# Patient Record
Sex: Male | Born: 1960 | Race: White | Hispanic: No | Marital: Married | Smoking: Never smoker
Health system: Southern US, Community
[De-identification: ages and names within clinical notes are randomized; demographics above are authoritative.]

## PROBLEM LIST (undated history)

## (undated) DIAGNOSIS — I2510 Atherosclerotic heart disease of native coronary artery without angina pectoris: Secondary | ICD-10-CM

## (undated) DIAGNOSIS — E1140 Type 2 diabetes mellitus with diabetic neuropathy, unspecified: Secondary | ICD-10-CM

## (undated) DIAGNOSIS — N529 Male erectile dysfunction, unspecified: Secondary | ICD-10-CM

## (undated) DIAGNOSIS — I10 Essential (primary) hypertension: Secondary | ICD-10-CM

## (undated) DIAGNOSIS — F32A Depression, unspecified: Secondary | ICD-10-CM

## (undated) DIAGNOSIS — E785 Hyperlipidemia, unspecified: Secondary | ICD-10-CM

## (undated) DIAGNOSIS — J342 Deviated nasal septum: Secondary | ICD-10-CM

## (undated) DIAGNOSIS — F329 Major depressive disorder, single episode, unspecified: Secondary | ICD-10-CM

## (undated) DIAGNOSIS — I255 Ischemic cardiomyopathy: Secondary | ICD-10-CM

## (undated) DIAGNOSIS — R809 Proteinuria, unspecified: Secondary | ICD-10-CM

## (undated) DIAGNOSIS — E538 Deficiency of other specified B group vitamins: Secondary | ICD-10-CM

## (undated) DIAGNOSIS — E1129 Type 2 diabetes mellitus with other diabetic kidney complication: Secondary | ICD-10-CM

## (undated) DIAGNOSIS — E11319 Type 2 diabetes mellitus with unspecified diabetic retinopathy without macular edema: Secondary | ICD-10-CM

## (undated) DIAGNOSIS — E119 Type 2 diabetes mellitus without complications: Secondary | ICD-10-CM

## (undated) DIAGNOSIS — E291 Testicular hypofunction: Secondary | ICD-10-CM

## (undated) DIAGNOSIS — E669 Obesity, unspecified: Secondary | ICD-10-CM

## (undated) DIAGNOSIS — Z87898 Personal history of other specified conditions: Secondary | ICD-10-CM

## (undated) HISTORY — PX: EYE SURGERY: SHX253

## (undated) HISTORY — PX: CHOLECYSTECTOMY: SHX55

## (undated) HISTORY — PX: TONSILLECTOMY: SUR1361

## (undated) HISTORY — PX: CORONARY ANGIOPLASTY: SHX604

## (undated) SURGERY — LEFT HEART CATH AND CORONARY ANGIOGRAPHY
Anesthesia: Moderate Sedation

---

## 2006-10-03 ENCOUNTER — Ambulatory Visit: Payer: Self-pay | Admitting: Unknown Physician Specialty

## 2007-04-22 ENCOUNTER — Ambulatory Visit: Payer: Self-pay | Admitting: Family Medicine

## 2009-03-17 ENCOUNTER — Ambulatory Visit: Payer: Self-pay | Admitting: Internal Medicine

## 2009-04-13 ENCOUNTER — Ambulatory Visit: Payer: Self-pay | Admitting: Internal Medicine

## 2014-03-06 HISTORY — PX: CORONARY ARTERY BYPASS GRAFT: SHX141

## 2014-11-20 ENCOUNTER — Encounter: Payer: Self-pay | Admitting: *Deleted

## 2014-11-21 ENCOUNTER — Encounter: Payer: Self-pay | Admitting: *Deleted

## 2014-11-21 ENCOUNTER — Encounter
Admission: RE | Disposition: A | Discharge status: Home / Self Care | Payer: Self-pay | Source: Ambulatory Visit | Attending: Cardiology

## 2014-11-21 ENCOUNTER — Ambulatory Visit
Admission: RE | Admit: 2014-11-21 | Discharge: 2014-11-21 | Disposition: A | Discharge status: Home / Self Care | Payer: BLUE CROSS/BLUE SHIELD | Source: Ambulatory Visit | Attending: Cardiology | Admitting: Cardiology

## 2014-11-21 DIAGNOSIS — Z801 Family history of malignant neoplasm of trachea, bronchus and lung: Secondary | ICD-10-CM | POA: Insufficient documentation

## 2014-11-21 DIAGNOSIS — Z794 Long term (current) use of insulin: Secondary | ICD-10-CM | POA: Diagnosis not present

## 2014-11-21 DIAGNOSIS — Z833 Family history of diabetes mellitus: Secondary | ICD-10-CM | POA: Insufficient documentation

## 2014-11-21 DIAGNOSIS — I2510 Atherosclerotic heart disease of native coronary artery without angina pectoris: Secondary | ICD-10-CM | POA: Diagnosis not present

## 2014-11-21 DIAGNOSIS — E1140 Type 2 diabetes mellitus with diabetic neuropathy, unspecified: Secondary | ICD-10-CM | POA: Insufficient documentation

## 2014-11-21 DIAGNOSIS — I10 Essential (primary) hypertension: Secondary | ICD-10-CM | POA: Diagnosis not present

## 2014-11-21 DIAGNOSIS — E669 Obesity, unspecified: Secondary | ICD-10-CM | POA: Insufficient documentation

## 2014-11-21 DIAGNOSIS — F329 Major depressive disorder, single episode, unspecified: Secondary | ICD-10-CM | POA: Diagnosis not present

## 2014-11-21 DIAGNOSIS — R079 Chest pain, unspecified: Secondary | ICD-10-CM | POA: Diagnosis present

## 2014-11-21 DIAGNOSIS — R808 Other proteinuria: Secondary | ICD-10-CM | POA: Diagnosis not present

## 2014-11-21 DIAGNOSIS — Z8261 Family history of arthritis: Secondary | ICD-10-CM | POA: Diagnosis not present

## 2014-11-21 DIAGNOSIS — Z7982 Long term (current) use of aspirin: Secondary | ICD-10-CM | POA: Diagnosis not present

## 2014-11-21 DIAGNOSIS — R0602 Shortness of breath: Secondary | ICD-10-CM | POA: Insufficient documentation

## 2014-11-21 DIAGNOSIS — I429 Cardiomyopathy, unspecified: Secondary | ICD-10-CM | POA: Insufficient documentation

## 2014-11-21 DIAGNOSIS — E785 Hyperlipidemia, unspecified: Secondary | ICD-10-CM | POA: Diagnosis not present

## 2014-11-21 DIAGNOSIS — R5383 Other fatigue: Secondary | ICD-10-CM | POA: Insufficient documentation

## 2014-11-21 DIAGNOSIS — Z8249 Family history of ischemic heart disease and other diseases of the circulatory system: Secondary | ICD-10-CM | POA: Insufficient documentation

## 2014-11-21 DIAGNOSIS — Z79899 Other long term (current) drug therapy: Secondary | ICD-10-CM | POA: Insufficient documentation

## 2014-11-21 DIAGNOSIS — Z6830 Body mass index (BMI) 30.0-30.9, adult: Secondary | ICD-10-CM | POA: Diagnosis not present

## 2014-11-21 DIAGNOSIS — Z9049 Acquired absence of other specified parts of digestive tract: Secondary | ICD-10-CM | POA: Diagnosis not present

## 2014-11-21 DIAGNOSIS — Z720 Tobacco use: Secondary | ICD-10-CM | POA: Diagnosis not present

## 2014-11-21 DIAGNOSIS — E11319 Type 2 diabetes mellitus with unspecified diabetic retinopathy without macular edema: Secondary | ICD-10-CM | POA: Diagnosis not present

## 2014-11-21 HISTORY — DX: Proteinuria, unspecified: E11.29

## 2014-11-21 HISTORY — DX: Testicular hypofunction: E29.1

## 2014-11-21 HISTORY — DX: Obesity, unspecified: E66.9

## 2014-11-21 HISTORY — DX: Type 2 diabetes mellitus without complications: E11.9

## 2014-11-21 HISTORY — DX: Type 2 diabetes mellitus with diabetic neuropathy, unspecified: E11.40

## 2014-11-21 HISTORY — DX: Male erectile dysfunction, unspecified: N52.9

## 2014-11-21 HISTORY — DX: Major depressive disorder, single episode, unspecified: F32.9

## 2014-11-21 HISTORY — DX: Essential (primary) hypertension: I10

## 2014-11-21 HISTORY — DX: Deviated nasal septum: J34.2

## 2014-11-21 HISTORY — DX: Hyperlipidemia, unspecified: E78.5

## 2014-11-21 HISTORY — DX: Depression, unspecified: F32.A

## 2014-11-21 HISTORY — DX: Type 2 diabetes mellitus with other diabetic kidney complication: R80.9

## 2014-11-21 HISTORY — DX: Type 2 diabetes mellitus with unspecified diabetic retinopathy without macular edema: E11.319

## 2014-11-21 HISTORY — PX: CARDIAC CATHETERIZATION: SHX172

## 2014-11-21 LAB — GLUCOSE, CAPILLARY: GLUCOSE-CAPILLARY: 267 mg/dL — AB (ref 65–99)

## 2014-11-21 SURGERY — LEFT HEART CATH
Anesthesia: Moderate Sedation

## 2014-11-21 MED ORDER — SODIUM CHLORIDE 0.9 % IJ SOLN: 3.0000 mL | Freq: Two times a day (BID) | INTRAMUSCULAR | Status: DC

## 2014-11-21 MED ORDER — SODIUM CHLORIDE 0.9 % IJ SOLN: 3.0000 mL | INTRAMUSCULAR | Status: DC | PRN

## 2014-11-21 MED ORDER — FENTANYL CITRATE (PF) 100 MCG/2ML IJ SOLN
INTRAMUSCULAR | Status: DC | PRN
  Administered 2014-11-21 (×2): 50 ug via INTRAVENOUS

## 2014-11-21 MED ORDER — MIDAZOLAM HCL 2 MG/2ML IJ SOLN
INTRAMUSCULAR | Status: AC
  Filled 2014-11-21: qty 2

## 2014-11-21 MED ORDER — SODIUM CHLORIDE 0.9 % IV SOLN: 250.0000 mL | INTRAVENOUS | Status: DC | PRN

## 2014-11-21 MED ORDER — MIDAZOLAM HCL 2 MG/2ML IJ SOLN
INTRAMUSCULAR | Status: DC | PRN
  Administered 2014-11-21 (×2): 1 mg via INTRAVENOUS

## 2014-11-21 MED ORDER — SODIUM CHLORIDE 0.9 % IJ SOLN
3.0000 mL | INTRAMUSCULAR | Status: DC | PRN
Start: 2014-11-21 — End: 2014-11-21

## 2014-11-21 MED ORDER — ONDANSETRON HCL 4 MG/2ML IJ SOLN
INTRAMUSCULAR | Status: DC | PRN
  Administered 2014-11-21: 4 mg via INTRAVENOUS

## 2014-11-21 MED ORDER — ASPIRIN 81 MG PO CHEW: 81.0000 mg | CHEWABLE_TABLET | ORAL | Status: DC

## 2014-11-21 MED ORDER — SODIUM CHLORIDE 0.9 % WEIGHT BASED INFUSION: 3.0000 mL/kg/h | INTRAVENOUS | Status: DC

## 2014-11-21 MED ORDER — SODIUM CHLORIDE 0.9 % WEIGHT BASED INFUSION: 1.0000 mL/kg/h | INTRAVENOUS | Status: DC

## 2014-11-21 MED ORDER — SODIUM CHLORIDE 0.9 % IJ SOLN
3.0000 mL | Freq: Two times a day (BID) | INTRAMUSCULAR | Status: DC
  Administered 2014-11-21: 3 mL via INTRAVENOUS

## 2014-11-21 MED ORDER — ONDANSETRON HCL 4 MG/2ML IJ SOLN
INTRAMUSCULAR | Status: AC
  Filled 2014-11-21: qty 2

## 2014-11-21 MED ORDER — SODIUM CHLORIDE 0.9 % IV SOLN
INTRAVENOUS | Status: DC
  Administered 2014-11-21: 08:00:00 via INTRAVENOUS

## 2014-11-21 MED ORDER — FENTANYL CITRATE (PF) 100 MCG/2ML IJ SOLN
INTRAMUSCULAR | Status: AC
  Filled 2014-11-21: qty 2

## 2014-11-21 MED ORDER — HEPARIN (PORCINE) IN NACL 2-0.9 UNIT/ML-% IJ SOLN
INTRAMUSCULAR | Status: AC
  Filled 2014-11-21: qty 1000

## 2014-11-21 MED ORDER — IOHEXOL 300 MG/ML  SOLN
INTRAMUSCULAR | Status: DC | PRN
  Administered 2014-11-21: 60 mL via INTRA_ARTERIAL

## 2014-11-21 SURGICAL SUPPLY — 8 items
CATH INFINITI 5FR ANG PIGTAIL (CATHETERS) ×3 IMPLANT
CATH INFINITI 5FR JL4 (CATHETERS) ×3 IMPLANT
CATH INFINITI JR4 5F (CATHETERS) ×3 IMPLANT
DEVICE CLOSURE MYNXGRIP 5F (Vascular Products) ×3 IMPLANT
KIT MANI 3VAL PERCEP (MISCELLANEOUS) ×3 IMPLANT
NEEDLE PERC 18GX7CM (NEEDLE) ×3 IMPLANT
PACK CARDIAC CATH (CUSTOM PROCEDURE TRAY) ×3 IMPLANT
SHEATH AVANTI 5FR X 11CM (SHEATH) ×3 IMPLANT

## 2014-11-21 NOTE — Discharge Instructions (Signed)

## 2015-01-13 ENCOUNTER — Other Ambulatory Visit: Payer: Self-pay | Admitting: Internal Medicine

## 2015-01-13 DIAGNOSIS — R2242 Localized swelling, mass and lump, left lower limb: Secondary | ICD-10-CM

## 2015-01-14 ENCOUNTER — Ambulatory Visit
Admission: RE | Admit: 2015-01-14 | Discharge: 2015-01-14 | Disposition: A | Discharge status: Home / Self Care | Payer: BLUE CROSS/BLUE SHIELD | Source: Ambulatory Visit | Attending: Internal Medicine | Admitting: Internal Medicine

## 2015-01-14 DIAGNOSIS — R2242 Localized swelling, mass and lump, left lower limb: Secondary | ICD-10-CM | POA: Insufficient documentation

## 2015-01-19 ENCOUNTER — Encounter: Payer: BLUE CROSS/BLUE SHIELD | Attending: Cardiology | Admitting: *Deleted

## 2015-01-19 VITALS — BP 110/62 | HR 84 | Ht 68.9 in | Wt 219.7 lb

## 2015-01-19 DIAGNOSIS — Z951 Presence of aortocoronary bypass graft: Secondary | ICD-10-CM | POA: Diagnosis present

## 2015-01-19 NOTE — Progress Notes (Signed)
Cardiac Individual Treatment Plan  Patient Details  Name: Ronnie Morales MRN: [id number] Date of Birth: 1960/12/04 Referring Provider:  Teodoro Spray, MD  Initial Encounter Date:  01/19/2015  Visit Diagnosis: No diagnosis found.  Patient's Home Medications on Admission:  Current outpatient prescriptions:  .  atorvastatin (LIPITOR) 40 MG tablet, Take by mouth., Disp: , Rfl:  .  aspirin 81 MG chewable tablet, Chew by mouth., Disp: , Rfl:  .  aspirin 81 MG tablet, Take 81 mg by mouth daily., Disp: , Rfl:  .  fluticasone (FLONASE) 50 MCG/ACT nasal spray, Place 1 spray into both nostrils daily. As needed for rhinitis, Disp: , Rfl:  .  furosemide (LASIX) 40 MG tablet, Take by mouth., Disp: , Rfl:  .  insulin aspart protamine- aspart (NOVOLOG MIX 70/30) (70-30) 100 UNIT/ML injection, Inject into the skin. 14-18 units sub q 3 times daily with meals, Disp: , Rfl:  .  insulin detemir (LEVEMIR) 100 UNIT/ML injection, Inject into the skin at bedtime. 60 units sub q nightly, Disp: , Rfl:  .  lisinopril (PRINIVIL,ZESTRIL) 10 MG tablet, Take 10 mg by mouth daily., Disp: , Rfl:  .  potassium chloride SA (K-DUR,KLOR-CON) 20 MEQ tablet, Take by mouth., Disp: , Rfl:   Past Medical History: Past Medical History  Diagnosis Date  . Depression   . Deviated septum   . Diabetes mellitus without complication ([place])   . Diabetic neuropathy ([place])   . Diabetic retinopathy ([place])   . Erectile dysfunction   . Hyperlipidemia   . Hypertension   . Hypogonadism in male   . Microalbuminuria due to type 2 diabetes mellitus ([place])   . Obesity     Tobacco Use: History  Smoking status  . Never Smoker   Smokeless tobacco  . Current User    Labs: Recent Review Flowsheet Data    There is no flowsheet data to display.       Exercise Target Goals:    Exercise Program Goal: Individual exercise prescription set with THRR, safety & activity barriers. Participant demonstrates ability to understand and  report RPE using BORG scale, to self-measure pulse accurately, and to acknowledge the importance of the exercise prescription.  Exercise Prescription Goal: Starting with aerobic activity 30 plus minutes a day, 3 days per week for initial exercise prescription. Provide home exercise prescription and guidelines that participant acknowledges understanding prior to discharge.  Activity Barriers & Risk Stratification:     Activity Barriers & Risk Stratification - 01/19/15 1506    Activity Barriers & Risk Stratification   Activity Barriers None   Risk Stratification High      6 Minute Walk:     6 Minute Walk      01/19/15 1507       6 Minute Walk   Phase Initial     Distance 1270 feet     Walk Time 6 minutes     Resting HR 84 bpm     Resting BP 110/62 mmHg     Max Ex. HR 90 bpm     Max Ex. BP 122/60 mmHg     RPE 7     Symptoms No        Initial Exercise Prescription:     Initial Exercise Prescription - 01/19/15 1500    Date of Initial Exercise Prescription   Date (p) 01/19/15      Exercise Prescription Changes:   Discharge Exercise Prescription (Final Exercise Prescription Changes):   Nutrition:  Target Goals: Understanding  of nutrition guidelines, daily intake of sodium 1500mg , cholesterol 200mg , calories 30% from fat and 7% or less from saturated fats, daily to have 5 or more servings of fruits and vegetables.  Biometrics:     Pre Biometrics - 01/19/15 1507    Pre Biometrics   Height 5' 8.9" (1.75 m)   Weight 219 lb 11.2 oz (99.655 kg)   Waist Circumference 42.5 inches   Hip Circumference 43.25 inches   Waist to Hip Ratio 0.98 %   BMI (Calculated) 32.6       Nutrition Therapy Plan and Nutrition Goals:     Nutrition Therapy & Goals - 01/19/15 1508    Nutrition Therapy   Diet diabetic diet   Drug/Food Interactions Statins/Certain Fruits   Intervention Plan   Intervention Using nutrition plan and personal goals to gain a healthy nutrition  lifestyle. Add exercise as prescribed.      Nutrition Discharge: Rate Your Plate Scores:   Nutrition Goals Re-Evaluation:   Psychosocial: Target Goals: Acknowledge presence or absence of depression, maximize coping skills, provide positive support system. Participant is able to verbalize types and ability to use techniques and skills needed for reducing stress and depression.  Initial Review & Psychosocial Screening:     Initial Psych Review & Screening - 01/19/15 1509    Initial Review   Current issues with Current Stress Concerns   Family Dynamics   Good Support System? Yes   Screening Interventions   Interventions Encouraged to exercise      Quality of Life Scores:   PHQ-9:     Recent Review Flowsheet Data    There is no flowsheet data to display.      Psychosocial Evaluation and Intervention:   Psychosocial Re-Evaluation:   Vocational Rehabilitation: Provide vocational rehab assistance to qualifying candidates.   Vocational Rehab Evaluation & Intervention:   Education: Education Goals: Education classes will be provided on a weekly basis, covering required topics. Participant will state understanding/return demonstration of topics presented.  Learning Barriers/Preferences:     Learning Barriers/Preferences - 01/19/15 1506    Learning Barriers/Preferences   Learning Barriers None   Learning Preferences Video;Written Material      Education Topics: General Nutrition Guidelines/Fats and Fiber: -Group instruction provided by verbal, written material, models and posters to present the general guidelines for heart healthy nutrition. Gives an explanation and review of dietary fats and fiber.   Controlling Sodium/Reading Food Labels: -Group verbal and written material supporting the discussion of sodium use in heart healthy nutrition. Review and explanation with models, verbal and written materials for utilization of the food label.   Exercise  Physiology & Risk Factors: - Group verbal and written instruction with models to review the exercise physiology of the cardiovascular system and associated critical values. Details cardiovascular disease risk factors and the goals associated with each risk factor.   Aerobic Exercise & Resistance Training: - Gives group verbal and written discussion on the health impact of inactivity. On the components of aerobic and resistive training programs and the benefits of this training and how to safely progress through these programs.   Flexibility, Balance, General Exercise Guidelines: - Provides group verbal and written instruction on the benefits of flexibility and balance training programs. Provides general exercise guidelines with specific guidelines to those with heart or lung disease. Demonstration and skill practice provided.   Stress Management: - Provides group verbal and written instruction about the health risks of elevated stress, cause of high stress, and healthy ways to  reduce stress.   Depression: - Provides group verbal and written instruction on the correlation between heart/lung disease and depressed mood, treatment options, and the stigmas associated with seeking treatment.   Anatomy & Physiology of the Heart: - Group verbal and written instruction and models provide basic cardiac anatomy and physiology, with the coronary electrical and arterial systems. Review of: AMI, Angina, Valve disease, Heart Failure, Cardiac Arrhythmia, Pacemakers, and the ICD.   Cardiac Procedures: - Group verbal and written instruction and models to describe the testing methods done to diagnose heart disease. Reviews the outcomes of the test results. Describes the treatment choices: Medical Management, Angioplasty, or Coronary Bypass Surgery.   Cardiac Medications: - Group verbal and written instruction to review commonly prescribed medications for heart disease. Reviews the medication, class of the  drug, and side effects. Includes the steps to properly store meds and maintain the prescription regimen.   Go Sex-Intimacy & Heart Disease, Get SMART - Goal Setting: - Group verbal and written instruction through game format to discuss heart disease and the return to sexual intimacy. Provides group verbal and written material to discuss and apply goal setting through the application of the S.M.A.R.T. Method.   Other Matters of the Heart: - Provides group verbal, written materials and models to describe Heart Failure, Angina, Valve Disease, and Diabetes in the realm of heart disease. Includes description of the disease process and treatment options available to the cardiac patient.   Exercise & Equipment Safety: - Individual verbal instruction and demonstration of equipment use and safety with use of the equipment.          Cardiac Rehab from 01/19/2015 in [facility] Cardiac Rehab   Date  01/19/15   Educator  C. EnterkinRN   Instruction Review Code  1- partially meets, needs review/practice      Infection Prevention: - Provides verbal and written material to individual with discussion of infection control including proper hand washing and proper equipment cleaning during exercise session.      Cardiac Rehab from 01/19/2015 in [facility] Cardiac Rehab   Date  01/19/15   Educator  C. [place]   Instruction Review Code  1- partially meets, needs review/practice      Falls Prevention: - Provides verbal and written material to individual with discussion of falls prevention and safety.      Cardiac Rehab from 01/19/2015 in [facility] Cardiac Rehab   Date  01/19/15   Educator  C. [place]   Instruction Review Code  2- meets goals/outcomes      Diabetes: - Individual verbal and written instruction to review signs/symptoms of diabetes, desired ranges of glucose level fasting, after meals and with exercise. Advice that pre and post exercise glucose checks will be done for 3 sessions at entry of  program.      Cardiac Rehab from 01/19/2015 in [facility] Cardiac Rehab   Date  01/19/15   Educator  C. [place]   Instruction Review Code  1- partially meets, needs review/practice       Knowledge Questionnaire Score:   Personal Goals and Risk Factors at Admission:     Personal Goals and Risk Factors at Admission - 01/19/15 1508    Personal Goals and Risk Factors on Admission    Weight Management Yes   Intervention Learn and follow the exercise and diet guidelines while in the program. Utilize the nutrition and education classes to help gain knowledge of the diet and exercise expectations in the program   Admit Weight 219 lb  11.2 oz (99.655 kg)   Increase Aerobic Exercise and Physical Activity Yes   Diabetes Yes   Goal Blood glucose control identified by blood glucose values, HgbA1C. Participant verbalizes understanding of the signs/symptoms of hyper/hypo glycemia, proper foot care and importance of medication and nutrition plan for blood glucose control.   Intervention Provide nutrition & aerobic exercise along with prescribed medications to achieve blood glucose in normal ranges: Fasting 65-99 mg/dL   Hypertension Yes   Goal Participant will see blood pressure controlled within the values of 140/13mm/Hg or within value directed by their physician.   Intervention Provide nutrition & aerobic exercise along with prescribed medications to achieve BP 140/90 or less.   Lipids Yes   Goal Cholesterol controlled with medications as prescribed, with individualized exercise RX and with personalized nutrition plan. Value goals: LDL < 70mg , HDL > 40mg . Participant states understanding of desired cholesterol values and following prescriptions.   Intervention Provide nutrition & aerobic exercise along with prescribed medications to achieve LDL 70mg , HDL >40mg .   Stress Yes   Goal To meet with psychosocial counselor for stress and relaxation information and guidance. To state understanding of  performing relaxation techniques and or identifying personal stressors.   Intervention Provide education on types of stress, identifiying stressors, and ways to cope with stress. Provide demonstration and active practice of relaxation techniques.      Personal Goals and Risk Factors Review:    Personal Goals Discharge (Final Personal Goals and Risk Factors Review):    ITP Comments:   Comments: Cardiac Rehab orientation appt done today.

## 2015-01-19 NOTE — Patient Instructions (Signed)
Patient Instructions  Patient Details  Name: Ronnie Morales MRN: [id number] Date of Birth: Jun 19, 1960 Referring Provider:  Teodoro Spray, MD  Below are the personal goals you chose as well as exercise and nutrition goals. Our goal is to help you keep on track towards obtaining and maintaining your goals. We will be discussing your progress on these goals with you throughout the program.  Initial Exercise Prescription:     Initial Exercise Prescription - 01/19/15 1500    Date of Initial Exercise Prescription   Date 01/19/15   Treadmill   MPH 2.2   Grade 0   Minutes 10   Bike   Level 0.4   Minutes 10   Recumbant Bike   Level 3   RPM 45   Watts 30   Minutes 15   NuStep   Level 3   Watts 30   Minutes 15   Arm Ergometer   Level 1   Watts 8   Minutes 10   Arm/Foot Ergometer   Level 4   Watts 12   Minutes 10   Cybex   Level 3   RPM 60   Minutes 10   Recumbant Elliptical   Level 1   RPM 40   Watts 10   Minutes 10   Elliptical   Level 1   Speed 3   Minutes 1   REL-XR   Level 3   Watts 40   Minutes 15   T5 Nustep   Level 2   Watts 20   Minutes 15   Biostep-RELP   Level 3   Watts 20   Minutes 15   Prescription Details   Frequency (times per week) 3   Duration Progress to 30 minutes of continuous aerobic without signs/symptoms of physical distress   Intensity   THRR REST +  30   Ratings of Perceived Exertion 11-15   Progression Continue progressive overload as per policy without signs/symptoms or physical distress.   Resistance Training   Training Prescription Yes   Weight 2   Reps 10-15      Exercise Goals: Frequency: Be able to perform aerobic exercise three times per week working toward 3-5 days per week.  Intensity: Work with a perceived exertion of 11 (fairly light) - 15 (hard) as tolerated. Follow your new exercise prescription and watch for changes in prescription as you progress with the program. Changes will be reviewed with you  when they are made.  Duration: You should be able to do 30 minutes of continuous aerobic exercise in addition to a 5 minute warm-up and a 5 minute cool-down routine.  Nutrition Goals: Your personal nutrition goals will be established when you do your nutrition analysis with the dietician.  The following are nutrition guidelines to follow: Cholesterol < 200mg /day Sodium < 1500mg /day Fiber: Men over 50 yrs - 30 grams per day  Personal Goals:     Personal Goals and Risk Factors at Admission - 01/19/15 1508    Personal Goals and Risk Factors on Admission    Weight Management Yes   Intervention Learn and follow the exercise and diet guidelines while in the program. Utilize the nutrition and education classes to help gain knowledge of the diet and exercise expectations in the program   Admit Weight 219 lb 11.2 oz (99.655 kg)   Increase Aerobic Exercise and Physical Activity Yes   Diabetes Yes   Goal Blood glucose control identified by blood glucose values, HgbA1C. Participant verbalizes understanding of  the signs/symptoms of hyper/hypo glycemia, proper foot care and importance of medication and nutrition plan for blood glucose control.   Intervention Provide nutrition & aerobic exercise along with prescribed medications to achieve blood glucose in normal ranges: Fasting 65-99 mg/dL   Hypertension Yes   Goal Participant will see blood pressure controlled within the values of 140/91mm/Hg or within value directed by their physician.   Intervention Provide nutrition & aerobic exercise along with prescribed medications to achieve BP 140/90 or less.   Lipids Yes   Goal Cholesterol controlled with medications as prescribed, with individualized exercise RX and with personalized nutrition plan. Value goals: LDL < 70mg , HDL > 40mg . Participant states understanding of desired cholesterol values and following prescriptions.   Intervention Provide nutrition & aerobic exercise along with prescribed  medications to achieve LDL 70mg , HDL >40mg .   Stress Yes   Goal To meet with psychosocial counselor for stress and relaxation information and guidance. To state understanding of performing relaxation techniques and or identifying personal stressors.   Intervention Provide education on types of stress, identifiying stressors, and ways to cope with stress. Provide demonstration and active practice of relaxation techniques.      Tobacco Use Initial Evaluation: History  Smoking status  . Never Smoker   Smokeless tobacco  . Current User    Copy of goals given to participant.

## 2015-01-20 NOTE — Progress Notes (Signed)
Cardiac Individual Treatment Plan  Patient Details  Name: Ronnie Morales MRN: [id number] Date of Birth: 1960-08-22 Referring Provider:  Teodoro Spray, MD  Initial Encounter Date: Date: 01/19/15  Visit Diagnosis: S/P CABG x 3 - Plan: CARDIAC REHAB 30 DAY REVIEW  Patient's Home Medications on Admission:  Current outpatient prescriptions:  .  atorvastatin (LIPITOR) 40 MG tablet, Take by mouth., Disp: , Rfl:  .  aspirin 81 MG chewable tablet, Chew by mouth., Disp: , Rfl:  .  aspirin 81 MG tablet, Take 81 mg by mouth daily., Disp: , Rfl:  .  fluticasone (FLONASE) 50 MCG/ACT nasal spray, Place 1 spray into both nostrils daily. As needed for rhinitis, Disp: , Rfl:  .  furosemide (LASIX) 40 MG tablet, Take by mouth., Disp: , Rfl:  .  insulin aspart protamine- aspart (NOVOLOG MIX 70/30) (70-30) 100 UNIT/ML injection, Inject into the skin. 14-18 units sub q 3 times daily with meals, Disp: , Rfl:  .  insulin detemir (LEVEMIR) 100 UNIT/ML injection, Inject into the skin at bedtime. 60 units sub q nightly, Disp: , Rfl:  .  lisinopril (PRINIVIL,ZESTRIL) 10 MG tablet, Take 10 mg by mouth daily., Disp: , Rfl:  .  potassium chloride SA (K-DUR,KLOR-CON) 20 MEQ tablet, Take by mouth., Disp: , Rfl:   Past Medical History: Past Medical History  Diagnosis Date  . Depression   . Deviated septum   . Diabetes mellitus without complication ([place])   . Diabetic neuropathy ([place])   . Diabetic retinopathy ([place])   . Erectile dysfunction   . Hyperlipidemia   . Hypertension   . Hypogonadism in male   . Microalbuminuria due to type 2 diabetes mellitus ([place])   . Obesity     Tobacco Use: History  Smoking status  . Never Smoker   Smokeless tobacco  . Current User    Labs: Recent Review Flowsheet Data    There is no flowsheet data to display.       Exercise Target Goals: Date: 01/19/15  Exercise Program Goal: Individual exercise prescription set with THRR, safety & activity barriers.  Participant demonstrates ability to understand and report RPE using BORG scale, to self-measure pulse accurately, and to acknowledge the importance of the exercise prescription.  Exercise Prescription Goal: Starting with aerobic activity 30 plus minutes a day, 3 days per week for initial exercise prescription. Provide home exercise prescription and guidelines that participant acknowledges understanding prior to discharge.  Activity Barriers & Risk Stratification:     Activity Barriers & Risk Stratification - 01/19/15 1506    Activity Barriers & Risk Stratification   Activity Barriers None   Risk Stratification High      6 Minute Walk:     6 Minute Walk      01/19/15 1507       6 Minute Walk   Phase Initial     Distance 1270 feet     Walk Time 6 minutes     Resting HR 84 bpm     Resting BP 110/62 mmHg     Max Ex. HR 90 bpm     Max Ex. BP 122/60 mmHg     RPE 7     Symptoms No        Initial Exercise Prescription:     Initial Exercise Prescription - 01/19/15 1500    Date of Initial Exercise Prescription   Date 01/19/15   Treadmill   MPH 2.2   Grade 0   Minutes 10  Bike   Level 0.4   Minutes 10   Recumbant Bike   Level 3   RPM 45   Watts 30   Minutes 15   NuStep   Level 3   Watts 30   Minutes 15   Arm Ergometer   Level 1   Watts 8   Minutes 10   Arm/Foot Ergometer   Level 4   Watts 12   Minutes 10   Cybex   Level 3   RPM 60   Minutes 10   Recumbant Elliptical   Level 1   RPM 40   Watts 10   Minutes 10   Elliptical   Level 1   Speed 3   Minutes 1   REL-XR   Level 3   Watts 40   Minutes 15   T5 Nustep   Level 2   Watts 20   Minutes 15   Biostep-RELP   Level 3   Watts 20   Minutes 15   Prescription Details   Frequency (times per week) 3   Duration Progress to 30 minutes of continuous aerobic without signs/symptoms of physical distress   Intensity   THRR REST +  30   Ratings of Perceived Exertion 11-15   Progression Continue  progressive overload as per policy without signs/symptoms or physical distress.   Resistance Training   Training Prescription Yes   Weight 2   Reps 10-15      Exercise Prescription Changes:   Discharge Exercise Prescription (Final Exercise Prescription Changes):   Nutrition:  Target Goals: Understanding of nutrition guidelines, daily intake of sodium 1500mg , cholesterol 200mg , calories 30% from fat and 7% or less from saturated fats, daily to have 5 or more servings of fruits and vegetables.  Biometrics:     Pre Biometrics - 01/19/15 1507    Pre Biometrics   Height 5' 8.9" (1.75 m)   Weight 219 lb 11.2 oz (99.655 kg)   Waist Circumference 42.5 inches   Hip Circumference 43.25 inches   Waist to Hip Ratio 0.98 %   BMI (Calculated) 32.6       Nutrition Therapy Plan and Nutrition Goals:     Nutrition Therapy & Goals - 01/19/15 1508    Nutrition Therapy   Diet diabetic diet   Drug/Food Interactions Statins/Certain Fruits   Intervention Plan   Intervention Using nutrition plan and personal goals to gain a healthy nutrition lifestyle. Add exercise as prescribed.      Nutrition Discharge: Rate Your Plate Scores:   Nutrition Goals Re-Evaluation:   Psychosocial: Target Goals: Acknowledge presence or absence of depression, maximize coping skills, provide positive support system. Participant is able to verbalize types and ability to use techniques and skills needed for reducing stress and depression.  Initial Review & Psychosocial Screening:     Initial Psych Review & Screening - 01/19/15 1509    Initial Review   Current issues with Current Stress Concerns   Family Dynamics   Good Support System? Yes   Screening Interventions   Interventions Encouraged to exercise      Quality of Life Scores:   PHQ-9:     Recent Review Flowsheet Data    Depression screen [facility] 2/9 01/19/2015   Decreased Interest 1   Down, Depressed, Hopeless 0   PHQ - 2 Score 1    Altered sleeping 0   Tired, decreased energy 1   Change in appetite 1   Feeling bad or failure about yourself  0  Trouble concentrating 0   Moving slowly or fidgety/restless 0   Suicidal thoughts 0   PHQ-9 Score 3   Difficult doing work/chores Not difficult at all      Psychosocial Evaluation and Intervention:   Psychosocial Re-Evaluation:   Vocational Rehabilitation: Provide vocational rehab assistance to qualifying candidates.   Vocational Rehab Evaluation & Intervention:   Education: Education Goals: Education classes will be provided on a weekly basis, covering required topics. Participant will state understanding/return demonstration of topics presented.  Learning Barriers/Preferences:     Learning Barriers/Preferences - 01/19/15 1506    Learning Barriers/Preferences   Learning Barriers None   Learning Preferences Video;Written Material      Education Topics: General Nutrition Guidelines/Fats and Fiber: -Group instruction provided by verbal, written material, models and posters to present the general guidelines for heart healthy nutrition. Gives an explanation and review of dietary fats and fiber.   Controlling Sodium/Reading Food Labels: -Group verbal and written material supporting the discussion of sodium use in heart healthy nutrition. Review and explanation with models, verbal and written materials for utilization of the food label.   Exercise Physiology & Risk Factors: - Group verbal and written instruction with models to review the exercise physiology of the cardiovascular system and associated critical values. Details cardiovascular disease risk factors and the goals associated with each risk factor.   Aerobic Exercise & Resistance Training: - Gives group verbal and written discussion on the health impact of inactivity. On the components of aerobic and resistive training programs and the benefits of this training and how to safely progress through  these programs.   Flexibility, Balance, General Exercise Guidelines: - Provides group verbal and written instruction on the benefits of flexibility and balance training programs. Provides general exercise guidelines with specific guidelines to those with heart or lung disease. Demonstration and skill practice provided.   Stress Management: - Provides group verbal and written instruction about the health risks of elevated stress, cause of high stress, and healthy ways to reduce stress.   Depression: - Provides group verbal and written instruction on the correlation between heart/lung disease and depressed mood, treatment options, and the stigmas associated with seeking treatment.   Anatomy & Physiology of the Heart: - Group verbal and written instruction and models provide basic cardiac anatomy and physiology, with the coronary electrical and arterial systems. Review of: AMI, Angina, Valve disease, Heart Failure, Cardiac Arrhythmia, Pacemakers, and the ICD.   Cardiac Procedures: - Group verbal and written instruction and models to describe the testing methods done to diagnose heart disease. Reviews the outcomes of the test results. Describes the treatment choices: Medical Management, Angioplasty, or Coronary Bypass Surgery.   Cardiac Medications: - Group verbal and written instruction to review commonly prescribed medications for heart disease. Reviews the medication, class of the drug, and side effects. Includes the steps to properly store meds and maintain the prescription regimen.   Go Sex-Intimacy & Heart Disease, Get SMART - Goal Setting: - Group verbal and written instruction through game format to discuss heart disease and the return to sexual intimacy. Provides group verbal and written material to discuss and apply goal setting through the application of the S.M.A.R.T. Method.   Other Matters of the Heart: - Provides group verbal, written materials and models to describe Heart  Failure, Angina, Valve Disease, and Diabetes in the realm of heart disease. Includes description of the disease process and treatment options available to the cardiac patient.   Exercise & Equipment Safety: - Individual verbal  instruction and demonstration of equipment use and safety with use of the equipment.          Cardiac Rehab from 01/19/2015 in [facility] Cardiac Rehab   Date  01/19/15   Educator  C. EnterkinRN   Instruction Review Code  1- partially meets, needs review/practice      Infection Prevention: - Provides verbal and written material to individual with discussion of infection control including proper hand washing and proper equipment cleaning during exercise session.      Cardiac Rehab from 01/19/2015 in [facility] Cardiac Rehab   Date  01/19/15   Educator  C. [place]   Instruction Review Code  1- partially meets, needs review/practice      Falls Prevention: - Provides verbal and written material to individual with discussion of falls prevention and safety.      Cardiac Rehab from 01/19/2015 in [facility] Cardiac Rehab   Date  01/19/15   Educator  C. [place]   Instruction Review Code  2- meets goals/outcomes      Diabetes: - Individual verbal and written instruction to review signs/symptoms of diabetes, desired ranges of glucose level fasting, after meals and with exercise. Advice that pre and post exercise glucose checks will be done for 3 sessions at entry of program.      Cardiac Rehab from 01/19/2015 in [facility] Cardiac Rehab   Date  01/19/15   Educator  C. [place]   Instruction Review Code  1- partially meets, needs review/practice       Knowledge Questionnaire Score:   Personal Goals and Risk Factors at Admission:     Personal Goals and Risk Factors at Admission - 01/19/15 1508    Personal Goals and Risk Factors on Admission    Weight Management Yes   Intervention Learn and follow the exercise and diet guidelines while in the program. Utilize the nutrition and  education classes to help gain knowledge of the diet and exercise expectations in the program   Admit Weight 219 lb 11.2 oz (99.655 kg)   Increase Aerobic Exercise and Physical Activity Yes   Diabetes Yes   Goal Blood glucose control identified by blood glucose values, HgbA1C. Participant verbalizes understanding of the signs/symptoms of hyper/hypo glycemia, proper foot care and importance of medication and nutrition plan for blood glucose control.   Intervention Provide nutrition & aerobic exercise along with prescribed medications to achieve blood glucose in normal ranges: Fasting 65-99 mg/dL   Hypertension Yes   Goal Participant will see blood pressure controlled within the values of 140/87mm/Hg or within value directed by their physician.   Intervention Provide nutrition & aerobic exercise along with prescribed medications to achieve BP 140/90 or less.   Lipids Yes   Goal Cholesterol controlled with medications as prescribed, with individualized exercise RX and with personalized nutrition plan. Value goals: LDL < 70mg , HDL > 40mg . Participant states understanding of desired cholesterol values and following prescriptions.   Intervention Provide nutrition & aerobic exercise along with prescribed medications to achieve LDL 70mg , HDL >40mg .   Stress Yes   Goal To meet with psychosocial counselor for stress and relaxation information and guidance. To state understanding of performing relaxation techniques and or identifying personal stressors.   Intervention Provide education on types of stress, identifiying stressors, and ways to cope with stress. Provide demonstration and active practice of relaxation techniques.      Personal Goals and Risk Factors Review:    Personal Goals Discharge (Final Personal Goals and Risk Factors Review):  ITP Comments:     ITP Comments      01/20/15 1446           ITP Comments 30 day review preparation     Continue with ITP  new start to program will  start sessions soon          Comments:

## 2015-01-21 NOTE — Addendum Note (Signed)
Addended by: Lynford Humphrey on: 01/21/2015 10:12 AM   Modules accepted: Orders

## 2015-01-27 ENCOUNTER — Ambulatory Visit: Payer: BLUE CROSS/BLUE SHIELD

## 2015-01-29 ENCOUNTER — Ambulatory Visit: Payer: BLUE CROSS/BLUE SHIELD

## 2015-02-03 ENCOUNTER — Ambulatory Visit: Payer: BLUE CROSS/BLUE SHIELD

## 2015-02-05 ENCOUNTER — Ambulatory Visit: Payer: BLUE CROSS/BLUE SHIELD

## 2015-02-10 ENCOUNTER — Ambulatory Visit: Payer: BLUE CROSS/BLUE SHIELD

## 2015-02-10 ENCOUNTER — Encounter: Payer: Self-pay | Admitting: *Deleted

## 2015-02-12 ENCOUNTER — Ambulatory Visit: Payer: BLUE CROSS/BLUE SHIELD

## 2015-02-15 NOTE — Progress Notes (Signed)
Cardiac Individual Treatment Plan  Patient Details  Name: Ronnie Morales MRN: [id number] Date of Birth: Oct 25, 1960 Referring Provider:  Teodoro Spray, MD  Initial Encounter Date: Date: 01/19/15  Visit Diagnosis: S/P CABG x 3 - Plan: CARDIAC REHAB 30 DAY REVIEW, CARDIAC REHAB 30 DAY REVIEW  Patient's Home Medications on Admission:  Current outpatient prescriptions:  .  atorvastatin (LIPITOR) 40 MG tablet, Take by mouth., Disp: , Rfl:  .  aspirin 81 MG chewable tablet, Chew by mouth., Disp: , Rfl:  .  aspirin 81 MG tablet, Take 81 mg by mouth daily., Disp: , Rfl:  .  fluticasone (FLONASE) 50 MCG/ACT nasal spray, Place 1 spray into both nostrils daily. As needed for rhinitis, Disp: , Rfl:  .  furosemide (LASIX) 40 MG tablet, Take by mouth., Disp: , Rfl:  .  insulin aspart protamine- aspart (NOVOLOG MIX 70/30) (70-30) 100 UNIT/ML injection, Inject into the skin. 14-18 units sub q 3 times daily with meals, Disp: , Rfl:  .  insulin detemir (LEVEMIR) 100 UNIT/ML injection, Inject into the skin at bedtime. 60 units sub q nightly, Disp: , Rfl:  .  lisinopril (PRINIVIL,ZESTRIL) 10 MG tablet, Take 10 mg by mouth daily., Disp: , Rfl:  .  potassium chloride SA (K-DUR,KLOR-CON) 20 MEQ tablet, Take by mouth., Disp: , Rfl:   Past Medical History: Past Medical History  Diagnosis Date  . Depression   . Deviated septum   . Diabetes mellitus without complication ([place])   . Diabetic neuropathy ([place])   . Diabetic retinopathy ([place])   . Erectile dysfunction   . Hyperlipidemia   . Hypertension   . Hypogonadism in male   . Microalbuminuria due to type 2 diabetes mellitus ([place])   . Obesity     Tobacco Use: History  Smoking status  . Never Smoker   Smokeless tobacco  . Current User    Labs: Recent Review Flowsheet Data    There is no flowsheet data to display.       Exercise Target Goals: Date: 01/19/15  Exercise Program Goal: Individual exercise prescription set with THRR,  safety & activity barriers. Participant demonstrates ability to understand and report RPE using BORG scale, to self-measure pulse accurately, and to acknowledge the importance of the exercise prescription.  Exercise Prescription Goal: Starting with aerobic activity 30 plus minutes a day, 3 days per week for initial exercise prescription. Provide home exercise prescription and guidelines that participant acknowledges understanding prior to discharge.  Activity Barriers & Risk Stratification:     Activity Barriers & Risk Stratification - 01/19/15 1506    Activity Barriers & Risk Stratification   Activity Barriers None   Risk Stratification High      6 Minute Walk:     6 Minute Walk      01/19/15 1507       6 Minute Walk   Phase Initial     Distance 1270 feet     Walk Time 6 minutes     Resting HR 84 bpm     Resting BP 110/62 mmHg     Max Ex. HR 90 bpm     Max Ex. BP 122/60 mmHg     RPE 7     Symptoms No        Initial Exercise Prescription:     Initial Exercise Prescription - 01/19/15 1500    Date of Initial Exercise Prescription   Date 01/19/15   Treadmill   MPH 2.2   Grade 0  Minutes 10   Bike   Level 0.4   Minutes 10   Recumbant Bike   Level 3   RPM 45   Watts 30   Minutes 15   NuStep   Level 3   Watts 30   Minutes 15   Arm Ergometer   Level 1   Watts 8   Minutes 10   Arm/Foot Ergometer   Level 4   Watts 12   Minutes 10   Cybex   Level 3   RPM 60   Minutes 10   Recumbant Elliptical   Level 1   RPM 40   Watts 10   Minutes 10   Elliptical   Level 1   Speed 3   Minutes 1   REL-XR   Level 3   Watts 40   Minutes 15   T5 Nustep   Level 2   Watts 20   Minutes 15   Biostep-RELP   Level 3   Watts 20   Minutes 15   Prescription Details   Frequency (times per week) 3   Duration Progress to 30 minutes of continuous aerobic without signs/symptoms of physical distress   Intensity   THRR REST +  30   Ratings of Perceived Exertion 11-15    Progression Continue progressive overload as per policy without signs/symptoms or physical distress.   Resistance Training   Training Prescription Yes   Weight 2   Reps 10-15      Exercise Prescription Changes:     Exercise Prescription Changes      02/10/15 0700           Exercise Review   Progression No  Has not started program; med review was 01/19/15          Discharge Exercise Prescription (Final Exercise Prescription Changes):     Exercise Prescription Changes - 02/10/15 0700    Exercise Review   Progression No  Has not started program; med review was 01/19/15      Nutrition:  Target Goals: Understanding of nutrition guidelines, daily intake of sodium 1500mg , cholesterol 200mg , calories 30% from fat and 7% or less from saturated fats, daily to have 5 or more servings of fruits and vegetables.  Biometrics:     Pre Biometrics - 01/19/15 1507    Pre Biometrics   Height 5' 8.9" (1.75 m)   Weight 219 lb 11.2 oz (99.655 kg)   Waist Circumference 42.5 inches   Hip Circumference 43.25 inches   Waist to Hip Ratio 0.98 %   BMI (Calculated) 32.6       Nutrition Therapy Plan and Nutrition Goals:     Nutrition Therapy & Goals - 01/19/15 1508    Nutrition Therapy   Diet diabetic diet   Drug/Food Interactions Statins/Certain Fruits   Intervention Plan   Intervention Using nutrition plan and personal goals to gain a healthy nutrition lifestyle. Add exercise as prescribed.      Nutrition Discharge: Rate Your Plate Scores:   Nutrition Goals Re-Evaluation:   Psychosocial: Target Goals: Acknowledge presence or absence of depression, maximize coping skills, provide positive support system. Participant is able to verbalize types and ability to use techniques and skills needed for reducing stress and depression.  Initial Review & Psychosocial Screening:     Initial Psych Review & Screening - 01/19/15 1509    Initial Review   Current issues with Current  Stress Concerns   Family Dynamics   Good Support System? Yes  Screening Interventions   Interventions Encouraged to exercise      Quality of Life Scores:   PHQ-9:     Recent Review Flowsheet Data    Depression screen [facility] 2/9 01/19/2015   Decreased Interest 1   Down, Depressed, Hopeless 0   PHQ - 2 Score 1   Altered sleeping 0   Tired, decreased energy 1   Change in appetite 1   Feeling bad or failure about yourself  0   Trouble concentrating 0   Moving slowly or fidgety/restless 0   Suicidal thoughts 0   PHQ-9 Score 3   Difficult doing work/chores Not difficult at all      Psychosocial Evaluation and Intervention:   Psychosocial Re-Evaluation:   Vocational Rehabilitation: Provide vocational rehab assistance to qualifying candidates.   Vocational Rehab Evaluation & Intervention:   Education: Education Goals: Education classes will be provided on a weekly basis, covering required topics. Participant will state understanding/return demonstration of topics presented.  Learning Barriers/Preferences:     Learning Barriers/Preferences - 01/19/15 1506    Learning Barriers/Preferences   Learning Barriers None   Learning Preferences Video;Written Material      Education Topics: General Nutrition Guidelines/Fats and Fiber: -Group instruction provided by verbal, written material, models and posters to present the general guidelines for heart healthy nutrition. Gives an explanation and review of dietary fats and fiber.   Controlling Sodium/Reading Food Labels: -Group verbal and written material supporting the discussion of sodium use in heart healthy nutrition. Review and explanation with models, verbal and written materials for utilization of the food label.   Exercise Physiology & Risk Factors: - Group verbal and written instruction with models to review the exercise physiology of the cardiovascular system and associated critical values. Details cardiovascular  disease risk factors and the goals associated with each risk factor.   Aerobic Exercise & Resistance Training: - Gives group verbal and written discussion on the health impact of inactivity. On the components of aerobic and resistive training programs and the benefits of this training and how to safely progress through these programs.   Flexibility, Balance, General Exercise Guidelines: - Provides group verbal and written instruction on the benefits of flexibility and balance training programs. Provides general exercise guidelines with specific guidelines to those with heart or lung disease. Demonstration and skill practice provided.   Stress Management: - Provides group verbal and written instruction about the health risks of elevated stress, cause of high stress, and healthy ways to reduce stress.   Depression: - Provides group verbal and written instruction on the correlation between heart/lung disease and depressed mood, treatment options, and the stigmas associated with seeking treatment.   Anatomy & Physiology of the Heart: - Group verbal and written instruction and models provide basic cardiac anatomy and physiology, with the coronary electrical and arterial systems. Review of: AMI, Angina, Valve disease, Heart Failure, Cardiac Arrhythmia, Pacemakers, and the ICD.   Cardiac Procedures: - Group verbal and written instruction and models to describe the testing methods done to diagnose heart disease. Reviews the outcomes of the test results. Describes the treatment choices: Medical Management, Angioplasty, or Coronary Bypass Surgery.   Cardiac Medications: - Group verbal and written instruction to review commonly prescribed medications for heart disease. Reviews the medication, class of the drug, and side effects. Includes the steps to properly store meds and maintain the prescription regimen.   Go Sex-Intimacy & Heart Disease, Get SMART - Goal Setting: - Group verbal and written  instruction through game  format to discuss heart disease and the return to sexual intimacy. Provides group verbal and written material to discuss and apply goal setting through the application of the S.M.A.R.T. Method.   Other Matters of the Heart: - Provides group verbal, written materials and models to describe Heart Failure, Angina, Valve Disease, and Diabetes in the realm of heart disease. Includes description of the disease process and treatment options available to the cardiac patient.   Exercise & Equipment Safety: - Individual verbal instruction and demonstration of equipment use and safety with use of the equipment.          Cardiac Rehab from 01/19/2015 in [facility] Cardiac and Pulmonary Rehab   Date  01/19/15   Educator  C. EnterkinRN   Instruction Review Code  1- partially meets, needs review/practice      Infection Prevention: - Provides verbal and written material to individual with discussion of infection control including proper hand washing and proper equipment cleaning during exercise session.      Cardiac Rehab from 01/19/2015 in [facility] Cardiac and Pulmonary Rehab   Date  01/19/15   Educator  C. [place]   Instruction Review Code  1- partially meets, needs review/practice      Falls Prevention: - Provides verbal and written material to individual with discussion of falls prevention and safety.      Cardiac Rehab from 01/19/2015 in [facility] Cardiac and Pulmonary Rehab   Date  01/19/15   Educator  C. [place]   Instruction Review Code  2- meets goals/outcomes      Diabetes: - Individual verbal and written instruction to review signs/symptoms of diabetes, desired ranges of glucose level fasting, after meals and with exercise. Advice that pre and post exercise glucose checks will be done for 3 sessions at entry of program.      Cardiac Rehab from 01/19/2015 in [facility] Cardiac and Pulmonary Rehab   Date  01/19/15   Educator  C. [place]   Instruction Review Code  1-  partially meets, needs review/practice       Knowledge Questionnaire Score:   Personal Goals and Risk Factors at Admission:     Personal Goals and Risk Factors at Admission - 01/19/15 1508    Personal Goals and Risk Factors on Admission    Weight Management Yes   Intervention Learn and follow the exercise and diet guidelines while in the program. Utilize the nutrition and education classes to help gain knowledge of the diet and exercise expectations in the program   Admit Weight 219 lb 11.2 oz (99.655 kg)   Increase Aerobic Exercise and Physical Activity Yes   Diabetes Yes   Goal Blood glucose control identified by blood glucose values, HgbA1C. Participant verbalizes understanding of the signs/symptoms of hyper/hypo glycemia, proper foot care and importance of medication and nutrition plan for blood glucose control.   Intervention Provide nutrition & aerobic exercise along with prescribed medications to achieve blood glucose in normal ranges: Fasting 65-99 mg/dL   Hypertension Yes   Goal Participant will see blood pressure controlled within the values of 140/26mm/Hg or within value directed by their physician.   Intervention Provide nutrition & aerobic exercise along with prescribed medications to achieve BP 140/90 or less.   Lipids Yes   Goal Cholesterol controlled with medications as prescribed, with individualized exercise RX and with personalized nutrition plan. Value goals: LDL < 70mg , HDL > 40mg . Participant states understanding of desired cholesterol values and following prescriptions.   Intervention Provide nutrition & aerobic exercise along  with prescribed medications to achieve LDL 70mg , HDL >40mg .   Stress Yes   Goal To meet with psychosocial counselor for stress and relaxation information and guidance. To state understanding of performing relaxation techniques and or identifying personal stressors.   Intervention Provide education on types of stress, identifiying stressors,  and ways to cope with stress. Provide demonstration and active practice of relaxation techniques.      Personal Goals and Risk Factors Review:    Personal Goals Discharge (Final Personal Goals and Risk Factors Review):    ITP Comments:     ITP Comments      01/20/15 1446 02/15/15 1245         ITP Comments 30 day review preparation     Continue with ITP  new start to program will start sessions soon Ready for 30 day review.  Continue with ITP  Has attended orientation, will start sessions soon.          Comments:

## 2015-02-17 ENCOUNTER — Ambulatory Visit: Payer: BLUE CROSS/BLUE SHIELD

## 2015-02-18 NOTE — Addendum Note (Signed)
Addended by: Lynford Humphrey on: 02/18/2015 10:47 AM   Modules accepted: Orders

## 2015-02-19 ENCOUNTER — Ambulatory Visit: Payer: BLUE CROSS/BLUE SHIELD

## 2015-02-24 ENCOUNTER — Ambulatory Visit: Payer: BLUE CROSS/BLUE SHIELD

## 2015-02-26 ENCOUNTER — Ambulatory Visit: Payer: BLUE CROSS/BLUE SHIELD

## 2015-03-03 ENCOUNTER — Ambulatory Visit: Payer: BLUE CROSS/BLUE SHIELD

## 2015-03-03 ENCOUNTER — Encounter: Payer: Self-pay | Admitting: *Deleted

## 2015-03-04 ENCOUNTER — Encounter: Payer: Self-pay | Admitting: *Deleted

## 2015-03-05 ENCOUNTER — Ambulatory Visit: Payer: BLUE CROSS/BLUE SHIELD

## 2015-03-10 ENCOUNTER — Ambulatory Visit: Payer: BLUE CROSS/BLUE SHIELD

## 2015-03-12 ENCOUNTER — Ambulatory Visit: Payer: BLUE CROSS/BLUE SHIELD

## 2015-03-12 ENCOUNTER — Telehealth: Payer: Self-pay | Admitting: *Deleted

## 2015-03-12 NOTE — Telephone Encounter (Signed)
Called to check on status to return to program. LMOM 

## 2015-03-12 NOTE — Progress Notes (Signed)
Cardiac Individual Treatment Plan  Patient Details  Name: Armad Dufour MRN: [id number] Date of Birth: 09/13/1960 Referring Provider:  Teodoro Spray, MD  Initial Encounter Date: Date: 01/19/15  Visit Diagnosis: S/P CABG x 3 - Plan: CARDIAC REHAB 30 DAY REVIEW, CARDIAC REHAB 30 DAY REVIEW, CARDIAC REHAB 30 DAY REVIEW  Patient's Home Medications on Admission:  Current outpatient prescriptions:  .  atorvastatin (LIPITOR) 40 MG tablet, Take by mouth., Disp: , Rfl:  .  aspirin 81 MG chewable tablet, Chew by mouth., Disp: , Rfl:  .  aspirin 81 MG tablet, Take 81 mg by mouth daily., Disp: , Rfl:  .  fluticasone (FLONASE) 50 MCG/ACT nasal spray, Place 1 spray into both nostrils daily. As needed for rhinitis, Disp: , Rfl:  .  furosemide (LASIX) 40 MG tablet, Take by mouth., Disp: , Rfl:  .  insulin aspart protamine- aspart (NOVOLOG MIX 70/30) (70-30) 100 UNIT/ML injection, Inject into the skin. 14-18 units sub q 3 times daily with meals, Disp: , Rfl:  .  insulin detemir (LEVEMIR) 100 UNIT/ML injection, Inject into the skin at bedtime. 60 units sub q nightly, Disp: , Rfl:  .  lisinopril (PRINIVIL,ZESTRIL) 10 MG tablet, Take 10 mg by mouth daily., Disp: , Rfl:  .  potassium chloride SA (K-DUR,KLOR-CON) 20 MEQ tablet, Take by mouth., Disp: , Rfl:   Past Medical History: Past Medical History  Diagnosis Date  . Depression   . Deviated septum   . Diabetes mellitus without complication ([place])   . Diabetic neuropathy ([place])   . Diabetic retinopathy ([place])   . Erectile dysfunction   . Hyperlipidemia   . Hypertension   . Hypogonadism in male   . Microalbuminuria due to type 2 diabetes mellitus ([place])   . Obesity     Tobacco Use: History  Smoking status  . Never Smoker   Smokeless tobacco  . Current User    Labs: Recent Review Flowsheet Data    There is no flowsheet data to display.       Exercise Target Goals: Date: 01/19/15  Exercise Program Goal: Individual exercise  prescription set with THRR, safety & activity barriers. Participant demonstrates ability to understand and report RPE using BORG scale, to self-measure pulse accurately, and to acknowledge the importance of the exercise prescription.  Exercise Prescription Goal: Starting with aerobic activity 30 plus minutes a day, 3 days per week for initial exercise prescription. Provide home exercise prescription and guidelines that participant acknowledges understanding prior to discharge.  Activity Barriers & Risk Stratification:     Activity Barriers & Risk Stratification - 01/19/15 1506    Activity Barriers & Risk Stratification   Activity Barriers None   Risk Stratification High      6 Minute Walk:     6 Minute Walk      01/19/15 1507       6 Minute Walk   Phase Initial     Distance 1270 feet     Walk Time 6 minutes     Resting HR 84 bpm     Resting BP 110/62 mmHg     Max Ex. HR 90 bpm     Max Ex. BP 122/60 mmHg     RPE 7     Symptoms No        Initial Exercise Prescription:     Initial Exercise Prescription - 01/19/15 1500    Date of Initial Exercise Prescription   Date 01/19/15   Treadmill   MPH 2.2  Grade 0   Minutes 10   Bike   Level 0.4   Minutes 10   Recumbant Bike   Level 3   RPM 45   Watts 30   Minutes 15   NuStep   Level 3   Watts 30   Minutes 15   Arm Ergometer   Level 1   Watts 8   Minutes 10   Arm/Foot Ergometer   Level 4   Watts 12   Minutes 10   Cybex   Level 3   RPM 60   Minutes 10   Recumbant Elliptical   Level 1   RPM 40   Watts 10   Minutes 10   Elliptical   Level 1   Speed 3   Minutes 1   REL-XR   Level 3   Watts 40   Minutes 15   T5 Nustep   Level 2   Watts 20   Minutes 15   Biostep-RELP   Level 3   Watts 20   Minutes 15   Prescription Details   Frequency (times per week) 3   Duration Progress to 30 minutes of continuous aerobic without signs/symptoms of physical distress   Intensity   THRR REST +  30   Ratings  of Perceived Exertion 11-15   Progression Continue progressive overload as per policy without signs/symptoms or physical distress.   Resistance Training   Training Prescription Yes   Weight 2   Reps 10-15      Exercise Prescription Changes:     Exercise Prescription Changes      02/10/15 0700 03/03/15 0700         Exercise Review   Progression No  Has not started program; med review was 01/19/15 No  Has not started program; med review was 01/19/15         Discharge Exercise Prescription (Final Exercise Prescription Changes):     Exercise Prescription Changes - 03/03/15 0700    Exercise Review   Progression No  Has not started program; med review was 01/19/15      Nutrition:  Target Goals: Understanding of nutrition guidelines, daily intake of sodium 1500mg , cholesterol 200mg , calories 30% from fat and 7% or less from saturated fats, daily to have 5 or more servings of fruits and vegetables.  Biometrics:     Pre Biometrics - 01/19/15 1507    Pre Biometrics   Height 5' 8.9" (1.75 m)   Weight 219 lb 11.2 oz (99.655 kg)   Waist Circumference 42.5 inches   Hip Circumference 43.25 inches   Waist to Hip Ratio 0.98 %   BMI (Calculated) 32.6       Nutrition Therapy Plan and Nutrition Goals:     Nutrition Therapy & Goals - 01/19/15 1508    Nutrition Therapy   Diet diabetic diet   Drug/Food Interactions Statins/Certain Fruits   Intervention Plan   Intervention Using nutrition plan and personal goals to gain a healthy nutrition lifestyle. Add exercise as prescribed.      Nutrition Discharge: Rate Your Plate Scores:   Nutrition Goals Re-Evaluation:   Psychosocial: Target Goals: Acknowledge presence or absence of depression, maximize coping skills, provide positive support system. Participant is able to verbalize types and ability to use techniques and skills needed for reducing stress and depression.  Initial Review & Psychosocial Screening:     Initial  Psych Review & Screening - 01/19/15 1509    Initial Review   Current issues with Current Stress  Concerns   Family Dynamics   Good Support System? Yes   Screening Interventions   Interventions Encouraged to exercise      Quality of Life Scores:   PHQ-9:     Recent Review Flowsheet Data    Depression screen [facility] 2/9 01/19/2015   Decreased Interest 1   Down, Depressed, Hopeless 0   PHQ - 2 Score 1   Altered sleeping 0   Tired, decreased energy 1   Change in appetite 1   Feeling bad or failure about yourself  0   Trouble concentrating 0   Moving slowly or fidgety/restless 0   Suicidal thoughts 0   PHQ-9 Score 3   Difficult doing work/chores Not difficult at all      Psychosocial Evaluation and Intervention:   Psychosocial Re-Evaluation:   Vocational Rehabilitation: Provide vocational rehab assistance to qualifying candidates.   Vocational Rehab Evaluation & Intervention:   Education: Education Goals: Education classes will be provided on a weekly basis, covering required topics. Participant will state understanding/return demonstration of topics presented.  Learning Barriers/Preferences:     Learning Barriers/Preferences - 01/19/15 1506    Learning Barriers/Preferences   Learning Barriers None   Learning Preferences Video;Written Material      Education Topics: General Nutrition Guidelines/Fats and Fiber: -Group instruction provided by verbal, written material, models and posters to present the general guidelines for heart healthy nutrition. Gives an explanation and review of dietary fats and fiber.   Controlling Sodium/Reading Food Labels: -Group verbal and written material supporting the discussion of sodium use in heart healthy nutrition. Review and explanation with models, verbal and written materials for utilization of the food label.   Exercise Physiology & Risk Factors: - Group verbal and written instruction with models to review the exercise  physiology of the cardiovascular system and associated critical values. Details cardiovascular disease risk factors and the goals associated with each risk factor.   Aerobic Exercise & Resistance Training: - Gives group verbal and written discussion on the health impact of inactivity. On the components of aerobic and resistive training programs and the benefits of this training and how to safely progress through these programs.   Flexibility, Balance, General Exercise Guidelines: - Provides group verbal and written instruction on the benefits of flexibility and balance training programs. Provides general exercise guidelines with specific guidelines to those with heart or lung disease. Demonstration and skill practice provided.   Stress Management: - Provides group verbal and written instruction about the health risks of elevated stress, cause of high stress, and healthy ways to reduce stress.   Depression: - Provides group verbal and written instruction on the correlation between heart/lung disease and depressed mood, treatment options, and the stigmas associated with seeking treatment.   Anatomy & Physiology of the Heart: - Group verbal and written instruction and models provide basic cardiac anatomy and physiology, with the coronary electrical and arterial systems. Review of: AMI, Angina, Valve disease, Heart Failure, Cardiac Arrhythmia, Pacemakers, and the ICD.   Cardiac Procedures: - Group verbal and written instruction and models to describe the testing methods done to diagnose heart disease. Reviews the outcomes of the test results. Describes the treatment choices: Medical Management, Angioplasty, or Coronary Bypass Surgery.   Cardiac Medications: - Group verbal and written instruction to review commonly prescribed medications for heart disease. Reviews the medication, class of the drug, and side effects. Includes the steps to properly store meds and maintain the prescription  regimen.   Go Sex-Intimacy & Heart Disease,  Get SMART - Goal Setting: - Group verbal and written instruction through game format to discuss heart disease and the return to sexual intimacy. Provides group verbal and written material to discuss and apply goal setting through the application of the S.M.A.R.T. Method.   Other Matters of the Heart: - Provides group verbal, written materials and models to describe Heart Failure, Angina, Valve Disease, and Diabetes in the realm of heart disease. Includes description of the disease process and treatment options available to the cardiac patient.   Exercise & Equipment Safety: - Individual verbal instruction and demonstration of equipment use and safety with use of the equipment.          Cardiac Rehab from 01/19/2015 in [facility] Cardiac and Pulmonary Rehab   Date  01/19/15   Educator  C. EnterkinRN   Instruction Review Code  1- partially meets, needs review/practice      Infection Prevention: - Provides verbal and written material to individual with discussion of infection control including proper hand washing and proper equipment cleaning during exercise session.      Cardiac Rehab from 01/19/2015 in [facility] Cardiac and Pulmonary Rehab   Date  01/19/15   Educator  C. [place]   Instruction Review Code  1- partially meets, needs review/practice      Falls Prevention: - Provides verbal and written material to individual with discussion of falls prevention and safety.      Cardiac Rehab from 01/19/2015 in [facility] Cardiac and Pulmonary Rehab   Date  01/19/15   Educator  C. [place]   Instruction Review Code  2- meets goals/outcomes      Diabetes: - Individual verbal and written instruction to review signs/symptoms of diabetes, desired ranges of glucose level fasting, after meals and with exercise. Advice that pre and post exercise glucose checks will be done for 3 sessions at entry of program.      Cardiac Rehab from 01/19/2015 in [facility] Cardiac  and Pulmonary Rehab   Date  01/19/15   Educator  C. [place]   Instruction Review Code  1- partially meets, needs review/practice       Knowledge Questionnaire Score:   Personal Goals and Risk Factors at Admission:     Personal Goals and Risk Factors at Admission - 01/19/15 1508    Personal Goals and Risk Factors on Admission    Weight Management Yes   Intervention Learn and follow the exercise and diet guidelines while in the program. Utilize the nutrition and education classes to help gain knowledge of the diet and exercise expectations in the program   Admit Weight 219 lb 11.2 oz (99.655 kg)   Increase Aerobic Exercise and Physical Activity Yes   Diabetes Yes   Goal Blood glucose control identified by blood glucose values, HgbA1C. Participant verbalizes understanding of the signs/symptoms of hyper/hypo glycemia, proper foot care and importance of medication and nutrition plan for blood glucose control.   Intervention Provide nutrition & aerobic exercise along with prescribed medications to achieve blood glucose in normal ranges: Fasting 65-99 mg/dL   Hypertension Yes   Goal Participant will see blood pressure controlled within the values of 140/80mm/Hg or within value directed by their physician.   Intervention Provide nutrition & aerobic exercise along with prescribed medications to achieve BP 140/90 or less.   Lipids Yes   Goal Cholesterol controlled with medications as prescribed, with individualized exercise RX and with personalized nutrition plan. Value goals: LDL < 70mg , HDL > 40mg . Participant states understanding of desired cholesterol  values and following prescriptions.   Intervention Provide nutrition & aerobic exercise along with prescribed medications to achieve LDL 70mg , HDL >40mg .   Stress Yes   Goal To meet with psychosocial counselor for stress and relaxation information and guidance. To state understanding of performing relaxation techniques and or identifying  personal stressors.   Intervention Provide education on types of stress, identifiying stressors, and ways to cope with stress. Provide demonstration and active practice of relaxation techniques.      Personal Goals and Risk Factors Review:    Personal Goals Discharge (Final Personal Goals and Risk Factors Review):    ITP Comments:     ITP Comments      01/20/15 1446 02/15/15 1245 03/12/15 1149       ITP Comments 30 day review preparation     Continue with ITP  new start to program will start sessions soon Ready for 30 day review.  Continue with ITP  Has attended orientation, will start sessions soon.  Ready for 30 day review. Continue with ITP.  HAs not attended sessions since orientation        Comments:

## 2015-03-17 ENCOUNTER — Ambulatory Visit: Payer: BLUE CROSS/BLUE SHIELD

## 2015-03-18 NOTE — Addendum Note (Signed)
Addended by: Lynford Humphrey on: 03/18/2015 07:18 AM   Modules accepted: Orders

## 2015-03-19 ENCOUNTER — Ambulatory Visit: Payer: BLUE CROSS/BLUE SHIELD

## 2015-03-24 ENCOUNTER — Ambulatory Visit: Payer: BLUE CROSS/BLUE SHIELD

## 2015-03-26 ENCOUNTER — Ambulatory Visit: Payer: BLUE CROSS/BLUE SHIELD

## 2015-03-30 ENCOUNTER — Encounter: Payer: Self-pay | Admitting: *Deleted

## 2015-03-31 ENCOUNTER — Ambulatory Visit: Payer: BLUE CROSS/BLUE SHIELD

## 2015-04-02 ENCOUNTER — Ambulatory Visit: Payer: BLUE CROSS/BLUE SHIELD

## 2015-04-07 ENCOUNTER — Ambulatory Visit: Payer: BLUE CROSS/BLUE SHIELD

## 2015-04-07 ENCOUNTER — Telehealth: Payer: Self-pay | Admitting: *Deleted

## 2015-04-07 NOTE — Progress Notes (Signed)
Cardiac Individual Treatment Plan  Patient Details  Name: Ronnie Morales MRN: [id number] Date of Birth: 20-Mar-1960 Referring Provider:  Teodoro Spray, MD  Initial Encounter Date: Date: 01/19/15  Visit Diagnosis: S/P CABG x 3 - Plan: CARDIAC REHAB 30 DAY REVIEW, CARDIAC REHAB 30 DAY REVIEW, CARDIAC REHAB 59 DAY REVIEW, CARDIAC REHAB 30 DAY REVIEW  Patient's Home Medications on Admission:  Current outpatient prescriptions:  .  atorvastatin (LIPITOR) 40 MG tablet, Take by mouth., Disp: , Rfl:  .  aspirin 81 MG chewable tablet, Chew by mouth., Disp: , Rfl:  .  aspirin 81 MG tablet, Take 81 mg by mouth daily., Disp: , Rfl:  .  fluticasone (FLONASE) 50 MCG/ACT nasal spray, Place 1 spray into both nostrils daily. As needed for rhinitis, Disp: , Rfl:  .  furosemide (LASIX) 40 MG tablet, Take by mouth., Disp: , Rfl:  .  insulin aspart protamine- aspart (NOVOLOG MIX 70/30) (70-30) 100 UNIT/ML injection, Inject into the skin. 14-18 units sub q 3 times daily with meals, Disp: , Rfl:  .  insulin detemir (LEVEMIR) 100 UNIT/ML injection, Inject into the skin at bedtime. 60 units sub q nightly, Disp: , Rfl:  .  lisinopril (PRINIVIL,ZESTRIL) 10 MG tablet, Take 10 mg by mouth daily., Disp: , Rfl:  .  potassium chloride SA (K-DUR,KLOR-CON) 20 MEQ tablet, Take by mouth., Disp: , Rfl:   Past Medical History: Past Medical History  Diagnosis Date  . Depression   . Deviated septum   . Diabetes mellitus without complication ([place])   . Diabetic neuropathy ([place])   . Diabetic retinopathy ([place])   . Erectile dysfunction   . Hyperlipidemia   . Hypertension   . Hypogonadism in male   . Microalbuminuria due to type 2 diabetes mellitus ([place])   . Obesity     Tobacco Use: History  Smoking status  . Never Smoker   Smokeless tobacco  . Current User    Labs: Recent Review Flowsheet Data    There is no flowsheet data to display.       Exercise Target Goals: Date: 01/19/15  Exercise Program  Goal: Individual exercise prescription set with THRR, safety & activity barriers. Participant demonstrates ability to understand and report RPE using BORG scale, to self-measure pulse accurately, and to acknowledge the importance of the exercise prescription.  Exercise Prescription Goal: Starting with aerobic activity 30 plus minutes a day, 3 days per week for initial exercise prescription. Provide home exercise prescription and guidelines that participant acknowledges understanding prior to discharge.  Activity Barriers & Risk Stratification:     Activity Barriers & Cardiac Risk Stratification - 01/19/15 1506    Activity Barriers & Cardiac Risk Stratification   Activity Barriers None   Cardiac Risk Stratification High      6 Minute Walk:     6 Minute Walk      01/19/15 1507       6 Minute Walk   Phase Initial     Distance 1270 feet     Walk Time 6 minutes     RPE 7     Symptoms No     Resting HR 84 bpm     Resting BP 110/62 mmHg     Max Ex. HR 90 bpm     Max Ex. BP 122/60 mmHg        Initial Exercise Prescription:     Initial Exercise Prescription - 01/19/15 1500    Date of Initial Exercise Prescription   Date  01/19/15   Treadmill   MPH 2.2   Grade 0   Minutes 10   Bike   Level 0.4   Minutes 10   Recumbant Bike   Level 3   RPM 45   Watts 30   Minutes 15   NuStep   Level 3   Watts 30   Minutes 15   Arm Ergometer   Level 1   Watts 8   Minutes 10   Arm/Foot Ergometer   Level 4   Watts 12   Minutes 10   Cybex   Level 3   RPM 60   Minutes 10   Recumbant Elliptical   Level 1   RPM 40   Watts 10   Minutes 10   Elliptical   Level 1   Speed 3   Minutes 1   REL-XR   Level 3   Watts 40   Minutes 15   T5 Nustep   Level 2   Watts 20   Minutes 15   Biostep-RELP   Level 3   Watts 20   Minutes 15   Prescription Details   Frequency (times per week) 3   Duration Progress to 30 minutes of continuous aerobic without signs/symptoms of physical  distress   Intensity   THRR REST +  30   Ratings of Perceived Exertion 11-15   Progression Continue progressive overload as per policy without signs/symptoms or physical distress.   Resistance Training   Training Prescription Yes   Weight 2   Reps 10-15      Exercise Prescription Changes:     Exercise Prescription Changes      02/10/15 0700 03/03/15 0700 03/30/15 1100       Exercise Review   Progression No  Has not started program; med review was 01/19/15 No  Has not started program; med review was 01/19/15 No  Has not started program; med review was 01/19/15        Discharge Exercise Prescription (Final Exercise Prescription Changes):     Exercise Prescription Changes - 03/30/15 1100    Exercise Review   Progression No  Has not started program; med review was 01/19/15      Nutrition:  Target Goals: Understanding of nutrition guidelines, daily intake of sodium 1500mg , cholesterol 200mg , calories 30% from fat and 7% or less from saturated fats, daily to have 5 or more servings of fruits and vegetables.  Biometrics:     Pre Biometrics - 01/19/15 1507    Pre Biometrics   Height 5' 8.9" (1.75 m)   Weight 219 lb 11.2 oz (99.655 kg)   Waist Circumference 42.5 inches   Hip Circumference 43.25 inches   Waist to Hip Ratio 0.98 %   BMI (Calculated) 32.6       Nutrition Therapy Plan and Nutrition Goals:     Nutrition Therapy & Goals - 01/19/15 1508    Nutrition Therapy   Diet diabetic diet   Drug/Food Interactions Statins/Certain Fruits   Intervention Plan   Intervention Using nutrition plan and personal goals to gain a healthy nutrition lifestyle. Add exercise as prescribed.      Nutrition Discharge: Rate Your Plate Scores:   Nutrition Goals Re-Evaluation:   Psychosocial: Target Goals: Acknowledge presence or absence of depression, maximize coping skills, provide positive support system. Participant is able to verbalize types and ability to use techniques  and skills needed for reducing stress and depression.  Initial Review & Psychosocial Screening:     Initial  Psych Review & Screening - 01/19/15 1509    Initial Review   Current issues with Current Stress Concerns   Family Dynamics   Good Support System? Yes   Screening Interventions   Interventions Encouraged to exercise      Quality of Life Scores:   PHQ-9:     Recent Review Flowsheet Data    Depression screen [facility] 2/9 01/19/2015   Decreased Interest 1   Down, Depressed, Hopeless 0   PHQ - 2 Score 1   Altered sleeping 0   Tired, decreased energy 1   Change in appetite 1   Feeling bad or failure about yourself  0   Trouble concentrating 0   Moving slowly or fidgety/restless 0   Suicidal thoughts 0   PHQ-9 Score 3   Difficult doing work/chores Not difficult at all      Psychosocial Evaluation and Intervention:   Psychosocial Re-Evaluation:   Vocational Rehabilitation: Provide vocational rehab assistance to qualifying candidates.   Vocational Rehab Evaluation & Intervention:   Education: Education Goals: Education classes will be provided on a weekly basis, covering required topics. Participant will state understanding/return demonstration of topics presented.  Learning Barriers/Preferences:     Learning Barriers/Preferences - 01/19/15 1506    Learning Barriers/Preferences   Learning Barriers None   Learning Preferences Video;Written Material      Education Topics: General Nutrition Guidelines/Fats and Fiber: -Group instruction provided by verbal, written material, models and posters to present the general guidelines for heart healthy nutrition. Gives an explanation and review of dietary fats and fiber.   Controlling Sodium/Reading Food Labels: -Group verbal and written material supporting the discussion of sodium use in heart healthy nutrition. Review and explanation with models, verbal and written materials for utilization of the food  label.   Exercise Physiology & Risk Factors: - Group verbal and written instruction with models to review the exercise physiology of the cardiovascular system and associated critical values. Details cardiovascular disease risk factors and the goals associated with each risk factor.   Aerobic Exercise & Resistance Training: - Gives group verbal and written discussion on the health impact of inactivity. On the components of aerobic and resistive training programs and the benefits of this training and how to safely progress through these programs.   Flexibility, Balance, General Exercise Guidelines: - Provides group verbal and written instruction on the benefits of flexibility and balance training programs. Provides general exercise guidelines with specific guidelines to those with heart or lung disease. Demonstration and skill practice provided.   Stress Management: - Provides group verbal and written instruction about the health risks of elevated stress, cause of high stress, and healthy ways to reduce stress.   Depression: - Provides group verbal and written instruction on the correlation between heart/lung disease and depressed mood, treatment options, and the stigmas associated with seeking treatment.   Anatomy & Physiology of the Heart: - Group verbal and written instruction and models provide basic cardiac anatomy and physiology, with the coronary electrical and arterial systems. Review of: AMI, Angina, Valve disease, Heart Failure, Cardiac Arrhythmia, Pacemakers, and the ICD.   Cardiac Procedures: - Group verbal and written instruction and models to describe the testing methods done to diagnose heart disease. Reviews the outcomes of the test results. Describes the treatment choices: Medical Management, Angioplasty, or Coronary Bypass Surgery.   Cardiac Medications: - Group verbal and written instruction to review commonly prescribed medications for heart disease. Reviews the  medication, class of the drug, and side effects.  Includes the steps to properly store meds and maintain the prescription regimen.   Go Sex-Intimacy & Heart Disease, Get SMART - Goal Setting: - Group verbal and written instruction through game format to discuss heart disease and the return to sexual intimacy. Provides group verbal and written material to discuss and apply goal setting through the application of the S.M.A.R.T. Method.   Other Matters of the Heart: - Provides group verbal, written materials and models to describe Heart Failure, Angina, Valve Disease, and Diabetes in the realm of heart disease. Includes description of the disease process and treatment options available to the cardiac patient.   Exercise & Equipment Safety: - Individual verbal instruction and demonstration of equipment use and safety with use of the equipment.          Cardiac Rehab from 01/19/2015 in [facility] Cardiac and Pulmonary Rehab   Date  01/19/15   Educator  C. EnterkinRN   Instruction Review Code  1- partially meets, needs review/practice      Infection Prevention: - Provides verbal and written material to individual with discussion of infection control including proper hand washing and proper equipment cleaning during exercise session.      Cardiac Rehab from 01/19/2015 in [facility] Cardiac and Pulmonary Rehab   Date  01/19/15   Educator  C. [place]   Instruction Review Code  1- partially meets, needs review/practice      Falls Prevention: - Provides verbal and written material to individual with discussion of falls prevention and safety.      Cardiac Rehab from 01/19/2015 in [facility] Cardiac and Pulmonary Rehab   Date  01/19/15   Educator  C. [place]   Instruction Review Code  2- meets goals/outcomes      Diabetes: - Individual verbal and written instruction to review signs/symptoms of diabetes, desired ranges of glucose level fasting, after meals and with exercise. Advice that pre and post  exercise glucose checks will be done for 3 sessions at entry of program.      Cardiac Rehab from 01/19/2015 in [facility] Cardiac and Pulmonary Rehab   Date  01/19/15   Educator  C. [place]   Instruction Review Code  1- partially meets, needs review/practice       Knowledge Questionnaire Score:   Personal Goals and Risk Factors at Admission:     Personal Goals and Risk Factors at Admission - 01/19/15 1508    Personal Goals and Risk Factors on Admission    Weight Management Yes   Intervention Learn and follow the exercise and diet guidelines while in the program. Utilize the nutrition and education classes to help gain knowledge of the diet and exercise expectations in the program   Admit Weight 219 lb 11.2 oz (99.655 kg)   Increase Aerobic Exercise and Physical Activity Yes   Diabetes Yes   Goal Blood glucose control identified by blood glucose values, HgbA1C. Participant verbalizes understanding of the signs/symptoms of hyper/hypo glycemia, proper foot care and importance of medication and nutrition plan for blood glucose control.   Intervention Provide nutrition & aerobic exercise along with prescribed medications to achieve blood glucose in normal ranges: Fasting 65-99 mg/dL   Hypertension Yes   Goal Participant will see blood pressure controlled within the values of 140/70mm/Hg or within value directed by their physician.   Intervention Provide nutrition & aerobic exercise along with prescribed medications to achieve BP 140/90 or less.   Lipids Yes   Goal Cholesterol controlled with medications as prescribed, with individualized exercise RX  and with personalized nutrition plan. Value goals: LDL < 70mg , HDL > 40mg . Participant states understanding of desired cholesterol values and following prescriptions.   Intervention Provide nutrition & aerobic exercise along with prescribed medications to achieve LDL 70mg , HDL >40mg .   Stress Yes   Goal To meet with psychosocial counselor for  stress and relaxation information and guidance. To state understanding of performing relaxation techniques and or identifying personal stressors.   Intervention Provide education on types of stress, identifiying stressors, and ways to cope with stress. Provide demonstration and active practice of relaxation techniques.      Personal Goals and Risk Factors Review:      Goals and Risk Factor Review      03/30/15 1122           Increase Aerobic Exercise and Physical Activity   Goals Progress/Improvement seen  No       Comments Ex. Rx. will be re-evaluated upon return due to extended absence          Personal Goals Discharge (Final Personal Goals and Risk Factors Review):      Goals and Risk Factor Review - 03/30/15 1122    Increase Aerobic Exercise and Physical Activity   Goals Progress/Improvement seen  No   Comments Ex. Rx. will be re-evaluated upon return due to extended absence      ITP Comments:     ITP Comments      01/20/15 1446 02/15/15 1245 03/12/15 1149 04/07/15 0836     ITP Comments 30 day review preparation     Continue with ITP  new start to program will start sessions soon Ready for 30 day review.  Continue with ITP  Has attended orientation, will start sessions soon.  Ready for 30 day review. Continue with ITP.  HAs not attended sessions since orientation Ready for 30 day review. Continue with ITP.  HAs not attended sessions since orientation       Comments:

## 2015-04-07 NOTE — Telephone Encounter (Signed)
Called to check on status to return to program. LMOM 

## 2015-04-09 ENCOUNTER — Ambulatory Visit: Payer: BLUE CROSS/BLUE SHIELD

## 2015-04-14 ENCOUNTER — Ambulatory Visit: Payer: BLUE CROSS/BLUE SHIELD

## 2015-04-15 NOTE — Addendum Note (Signed)
Addended by: Lynford Humphrey on: 04/15/2015 07:28 AM   Modules accepted: Orders

## 2015-04-16 ENCOUNTER — Ambulatory Visit: Payer: BLUE CROSS/BLUE SHIELD

## 2015-04-21 ENCOUNTER — Ambulatory Visit: Payer: BLUE CROSS/BLUE SHIELD

## 2015-04-22 ENCOUNTER — Telehealth: Payer: Self-pay | Admitting: *Deleted

## 2015-04-22 NOTE — Telephone Encounter (Signed)
Called to check on status to return to program. LMOM 

## 2015-04-23 ENCOUNTER — Ambulatory Visit: Payer: BLUE CROSS/BLUE SHIELD

## 2015-04-27 ENCOUNTER — Encounter: Payer: Self-pay | Admitting: *Deleted

## 2015-04-28 ENCOUNTER — Ambulatory Visit: Payer: BLUE CROSS/BLUE SHIELD

## 2015-04-30 ENCOUNTER — Ambulatory Visit: Payer: BLUE CROSS/BLUE SHIELD

## 2015-05-01 ENCOUNTER — Encounter: Payer: Self-pay | Admitting: *Deleted

## 2015-05-01 ENCOUNTER — Telehealth: Payer: Self-pay | Admitting: *Deleted

## 2015-05-01 DIAGNOSIS — Z951 Presence of aortocoronary bypass graft: Secondary | ICD-10-CM

## 2015-05-01 NOTE — Progress Notes (Signed)
Cardiac Individual Treatment Plan  Patient Details  Name: Ronnie Morales MRN: [id number] Date of Birth: 11-Nov-1960 Referring Provider:  No ref. provider found  Initial Encounter Date:       Cardiac Rehab from 01/19/2015 in [facility] Cardiac and Pulmonary Rehab   Date  01/19/15      Visit Diagnosis: S/P CABG x 3  Patient's Home Medications on Admission:  Current outpatient prescriptions:  .  aspirin 81 MG chewable tablet, Chew by mouth., Disp: , Rfl:  .  aspirin 81 MG tablet, Take 81 mg by mouth daily., Disp: , Rfl:  .  atorvastatin (LIPITOR) 40 MG tablet, Take by mouth., Disp: , Rfl:  .  fluticasone (FLONASE) 50 MCG/ACT nasal spray, Place 1 spray into both nostrils daily. As needed for rhinitis, Disp: , Rfl:  .  furosemide (LASIX) 40 MG tablet, Take by mouth., Disp: , Rfl:  .  insulin aspart protamine- aspart (NOVOLOG MIX 70/30) (70-30) 100 UNIT/ML injection, Inject into the skin. 14-18 units sub q 3 times daily with meals, Disp: , Rfl:  .  insulin detemir (LEVEMIR) 100 UNIT/ML injection, Inject into the skin at bedtime. 60 units sub q nightly, Disp: , Rfl:  .  lisinopril (PRINIVIL,ZESTRIL) 10 MG tablet, Take 10 mg by mouth daily., Disp: , Rfl:  .  potassium chloride SA (K-DUR,KLOR-CON) 20 MEQ tablet, Take by mouth., Disp: , Rfl:   Past Medical History: Past Medical History  Diagnosis Date  . Depression   . Deviated septum   . Diabetes mellitus without complication ([place])   . Diabetic neuropathy ([place])   . Diabetic retinopathy ([place])   . Erectile dysfunction   . Hyperlipidemia   . Hypertension   . Hypogonadism in male   . Microalbuminuria due to type 2 diabetes mellitus ([place])   . Obesity     Tobacco Use: History  Smoking status  . Never Smoker   Smokeless tobacco  . Current User    Labs: Recent Review Flowsheet Data    There is no flowsheet data to display.       Exercise Target Goals:    Exercise Program Goal: Individual exercise prescription set with  THRR, safety & activity barriers. Participant demonstrates ability to understand and report RPE using BORG scale, to self-measure pulse accurately, and to acknowledge the importance of the exercise prescription.  Exercise Prescription Goal: Starting with aerobic activity 30 plus minutes a day, 3 days per week for initial exercise prescription. Provide home exercise prescription and guidelines that participant acknowledges understanding prior to discharge.  Activity Barriers & Risk Stratification:     Activity Barriers & Cardiac Risk Stratification - 01/19/15 1506    Activity Barriers & Cardiac Risk Stratification   Activity Barriers None   Cardiac Risk Stratification High      6 Minute Walk:     6 Minute Walk      01/19/15 1507       6 Minute Walk   Phase Initial     Distance 1270 feet     Walk Time 6 minutes     RPE 7     Symptoms No     Resting HR 84 bpm     Resting BP 110/62 mmHg     Max Ex. HR 90 bpm     Max Ex. BP 122/60 mmHg        Initial Exercise Prescription:     Initial Exercise Prescription - 01/19/15 1500    Date of Initial Exercise Prescription  Date 01/19/15   Treadmill   MPH 2.2   Grade 0   Minutes 10   Bike   Level 0.4   Minutes 10   Recumbant Bike   Level 3   RPM 45   Watts 30   Minutes 15   NuStep   Level 3   Watts 30   Minutes 15   Arm Ergometer   Level 1   Watts 8   Minutes 10   Arm/Foot Ergometer   Level 4   Watts 12   Minutes 10   Cybex   Level 3   RPM 60   Minutes 10   Recumbant Elliptical   Level 1   RPM 40   Watts 10   Minutes 10   Elliptical   Level 1   Speed 3   Minutes 1   REL-XR   Level 3   Watts 40   Minutes 15   T5 Nustep   Level 2   Watts 20   Minutes 15   Biostep-RELP   Level 3   Watts 20   Minutes 15   Prescription Details   Frequency (times per week) 3   Duration Progress to 30 minutes of continuous aerobic without signs/symptoms of physical distress   Intensity   THRR REST +  30    Ratings of Perceived Exertion 11-15   Progression   Progression Continue progressive overload as per policy without signs/symptoms or physical distress.   Resistance Training   Training Prescription Yes   Weight 2   Reps 10-15      Perform Capillary Blood Glucose checks as needed.  Exercise Prescription Changes:     Exercise Prescription Changes      02/10/15 0700 03/03/15 0700 03/30/15 1100 04/27/15 1200     Exercise Review   Progression No  Has not started program; med review was 01/19/15 No  Has not started program; med review was 01/19/15 No  Has not started program; med review was 01/19/15 No  Has not started program; med review was 01/19/15       Exercise Comments:   Discharge Exercise Prescription (Final Exercise Prescription Changes):     Exercise Prescription Changes - 04/27/15 1200    Exercise Review   Progression No  Has not started program; med review was 01/19/15      Nutrition:  Target Goals: Understanding of nutrition guidelines, daily intake of sodium 1500mg , cholesterol 200mg , calories 30% from fat and 7% or less from saturated fats, daily to have 5 or more servings of fruits and vegetables.  Biometrics:     Pre Biometrics - 01/19/15 1507    Pre Biometrics   Height 5' 8.9" (1.75 m)   Weight 219 lb 11.2 oz (99.655 kg)   Waist Circumference 42.5 inches   Hip Circumference 43.25 inches   Waist to Hip Ratio 0.98 %   BMI (Calculated) 32.6       Nutrition Therapy Plan and Nutrition Goals:     Nutrition Therapy & Goals - 01/19/15 1508    Nutrition Therapy   Diet diabetic diet   Drug/Food Interactions Statins/Certain Fruits   Intervention Plan   Intervention (read-only) Using nutrition plan and personal goals to gain a healthy nutrition lifestyle. Add exercise as prescribed.      Nutrition Discharge: Rate Your Plate Scores:   Nutrition Goals Re-Evaluation:   Psychosocial: Target Goals: Acknowledge presence or absence of depression,  maximize coping skills, provide positive support system. Participant is able to  verbalize types and ability to use techniques and skills needed for reducing stress and depression.  Initial Review & Psychosocial Screening:     Initial Psych Review & Screening - 01/19/15 1509    Initial Review   Current issues with Current Stress Concerns   Family Dynamics   Good Support System? Yes   Screening Interventions   Interventions Encouraged to exercise      Quality of Life Scores:   PHQ-9:     Recent Review Flowsheet Data    Depression screen [facility] 2/9 01/19/2015   Decreased Interest 1   Down, Depressed, Hopeless 0   PHQ - 2 Score 1   Altered sleeping 0   Tired, decreased energy 1   Change in appetite 1   Feeling bad or failure about yourself  0   Trouble concentrating 0   Moving slowly or fidgety/restless 0   Suicidal thoughts 0   PHQ-9 Score 3   Difficult doing work/chores Not difficult at all      Psychosocial Evaluation and Intervention:   Psychosocial Re-Evaluation:     Psychosocial Re-Evaluation      05/01/15 1221           Psychosocial Re-Evaluation   Comments I called Fayrene Helper and he said he is walking 5 miles per day 6-7 days/week. He said he is back at work "probably too much" so he won't be able to attend Cardiac Rehab.          Vocational Rehabilitation: Provide vocational rehab assistance to qualifying candidates.   Vocational Rehab Evaluation & Intervention:   Education: Education Goals: Education classes will be provided on a weekly basis, covering required topics. Participant will state understanding/return demonstration of topics presented.  Learning Barriers/Preferences:     Learning Barriers/Preferences - 01/19/15 1506    Learning Barriers/Preferences   Learning Barriers None   Learning Preferences Video;Written Material      Education Topics: General Nutrition Guidelines/Fats and Fiber: -Group instruction provided by verbal, written  material, models and posters to present the general guidelines for heart healthy nutrition. Gives an explanation and review of dietary fats and fiber.   Controlling Sodium/Reading Food Labels: -Group verbal and written material supporting the discussion of sodium use in heart healthy nutrition. Review and explanation with models, verbal and written materials for utilization of the food label.   Exercise Physiology & Risk Factors: - Group verbal and written instruction with models to review the exercise physiology of the cardiovascular system and associated critical values. Details cardiovascular disease risk factors and the goals associated with each risk factor.   Aerobic Exercise & Resistance Training: - Gives group verbal and written discussion on the health impact of inactivity. On the components of aerobic and resistive training programs and the benefits of this training and how to safely progress through these programs.   Flexibility, Balance, General Exercise Guidelines: - Provides group verbal and written instruction on the benefits of flexibility and balance training programs. Provides general exercise guidelines with specific guidelines to those with heart or lung disease. Demonstration and skill practice provided.   Stress Management: - Provides group verbal and written instruction about the health risks of elevated stress, cause of high stress, and healthy ways to reduce stress.   Depression: - Provides group verbal and written instruction on the correlation between heart/lung disease and depressed mood, treatment options, and the stigmas associated with seeking treatment.   Anatomy & Physiology of the Heart: - Group verbal and written instruction and models provide  basic cardiac anatomy and physiology, with the coronary electrical and arterial systems. Review of: AMI, Angina, Valve disease, Heart Failure, Cardiac Arrhythmia, Pacemakers, and the ICD.   Cardiac  Procedures: - Group verbal and written instruction and models to describe the testing methods done to diagnose heart disease. Reviews the outcomes of the test results. Describes the treatment choices: Medical Management, Angioplasty, or Coronary Bypass Surgery.   Cardiac Medications: - Group verbal and written instruction to review commonly prescribed medications for heart disease. Reviews the medication, class of the drug, and side effects. Includes the steps to properly store meds and maintain the prescription regimen.   Go Sex-Intimacy & Heart Disease, Get SMART - Goal Setting: - Group verbal and written instruction through game format to discuss heart disease and the return to sexual intimacy. Provides group verbal and written material to discuss and apply goal setting through the application of the S.M.A.R.T. Method.   Other Matters of the Heart: - Provides group verbal, written materials and models to describe Heart Failure, Angina, Valve Disease, and Diabetes in the realm of heart disease. Includes description of the disease process and treatment options available to the cardiac patient.   Exercise & Equipment Safety: - Individual verbal instruction and demonstration of equipment use and safety with use of the equipment.          Cardiac Rehab from 01/19/2015 in [facility] Cardiac and Pulmonary Rehab   Date  01/19/15   Educator  C. EnterkinRN   Instruction Review Code  1- partially meets, needs review/practice      Infection Prevention: - Provides verbal and written material to individual with discussion of infection control including proper hand washing and proper equipment cleaning during exercise session.      Cardiac Rehab from 01/19/2015 in [facility] Cardiac and Pulmonary Rehab   Date  01/19/15   Educator  C. [place]   Instruction Review Code  1- partially meets, needs review/practice      Falls Prevention: - Provides verbal and written material to individual with discussion of  falls prevention and safety.      Cardiac Rehab from 01/19/2015 in [facility] Cardiac and Pulmonary Rehab   Date  01/19/15   Educator  C. [place]   Instruction Review Code  2- meets goals/outcomes      Diabetes: - Individual verbal and written instruction to review signs/symptoms of diabetes, desired ranges of glucose level fasting, after meals and with exercise. Advice that pre and post exercise glucose checks will be done for 3 sessions at entry of program.      Cardiac Rehab from 01/19/2015 in [facility] Cardiac and Pulmonary Rehab   Date  01/19/15   Educator  C. [place]   Instruction Review Code  1- partially meets, needs review/practice       Knowledge Questionnaire Score:   Personal Goals and Risk Factors at Admission:     Personal Goals and Risk Factors at Admission - 01/19/15 1508    Core Components/Risk Factors/Patient Goals on Admission    Weight Management Yes   Intervention (read-only) Learn and follow the exercise and diet guidelines while in the program. Utilize the nutrition and education classes to help gain knowledge of the diet and exercise expectations in the program   Admit Weight 219 lb 11.2 oz (99.655 kg)   Sedentary Yes   Diabetes Yes   Goal Blood glucose control identified by blood glucose values, HgbA1C. Participant verbalizes understanding of the signs/symptoms of hyper/hypo glycemia, proper foot care and importance of  medication and nutrition plan for blood glucose control.   Intervention (read-only) Provide nutrition & aerobic exercise along with prescribed medications to achieve blood glucose in normal ranges: Fasting 65-99 mg/dL   Hypertension Yes   Goal Participant will see blood pressure controlled within the values of 140/102mm/Hg or within value directed by their physician.   Intervention (read-only) Provide nutrition & aerobic exercise along with prescribed medications to achieve BP 140/90 or less.   Lipids Yes   Goal Cholesterol controlled with  medications as prescribed, with individualized exercise RX and with personalized nutrition plan. Value goals: LDL < 70mg , HDL > 40mg . Participant states understanding of desired cholesterol values and following prescriptions.   Intervention (read-only) Provide nutrition & aerobic exercise along with prescribed medications to achieve LDL 70mg , HDL >40mg .   Stress Yes   Goal To meet with psychosocial counselor for stress and relaxation information and guidance. To state understanding of performing relaxation techniques and or identifying personal stressors.   Intervention (read-only) Provide education on types of stress, identifiying stressors, and ways to cope with stress. Provide demonstration and active practice of relaxation techniques.      Personal Goals and Risk Factors Review:      Goals and Risk Factor Review      03/30/15 1122 05/01/15 1220         Core Components/Risk Factors/Patient Goals Review   Personal Goals Review Increase Aerobic Exercise and Physical Activity Sedentary      Review  I called Fayrene Helper and he said he is walking 5 miles per day 6-7 days/week. He said he is back at work "probably too much" so he won't be able to attend Cardiac Rehab.      Expected Outcomes  Cont to get aerobic exericse.       Increase Aerobic Exercise and Physical Activity (read-only)   Goals Progress/Improvement seen  No       Comments Ex. Rx. will be re-evaluated upon return due to extended absence          Personal Goals Discharge (Final Personal Goals and Risk Factors Review):      Goals and Risk Factor Review - 05/01/15 1220    Core Components/Risk Factors/Patient Goals Review   Personal Goals Review Sedentary   Review I called Fayrene Helper and he said he is walking 5 miles per day 6-7 days/week. He said he is back at work "probably too much" so he won't be able to attend Cardiac Rehab.   Expected Outcomes Cont to get aerobic exericse.       ITP Comments:     ITP Comments       01/20/15 1446 02/15/15 1245 03/12/15 1149 04/07/15 0836 05/01/15 1220   ITP Comments 30 day review preparation     Continue with ITP  new start to program will start sessions soon Ready for 30 day review.  Continue with ITP  Has attended orientation, will start sessions soon.  Ready for 30 day review. Continue with ITP.  HAs not attended sessions since orientation Ready for 30 day review. Continue with ITP.  HAs not attended sessions since orientation I called Fayrene Helper and he said he is walking 5 miles per day 6-7 days/week. He said he is back at work "probably too much" so he won't be able to attend Cardiac Rehab.      Comments: I called Fayrene Helper and he said he is walking 5 miles per day 6-7 days/week. He said he is back at work "probably too much" so he  won't be able to attend Cardiac Rehab.

## 2015-05-01 NOTE — Telephone Encounter (Signed)
I called Ronnie Morales and he said he is walking 5 miles per day 6-7 days/week. He said he is back at work "probably too much" so he won't be able to attend Cardiac Rehab.

## 2015-05-01 NOTE — Progress Notes (Signed)
Discharge Summary  Patient Details  Name: Ronnie Morales MRN: [id number] Date of Birth: 05-27-1960 Referring Provider:  No ref. provider found   Number of Visits: 1/36  Reason for Discharge:  Early Exit:  Back to work  Smoking History:  History  Smoking status  . Never Smoker   Smokeless tobacco  . Current User    Diagnosis:  S/P CABG x 3 - Plan: CARDIAC REHAB 30 DAY REVIEW  ADL UCSD:   Initial Exercise Prescription:     Initial Exercise Prescription - 01/19/15 1500    Date of Initial Exercise Prescription   Date 01/19/15   Treadmill   MPH 2.2   Grade 0   Minutes 10   Bike   Level 0.4   Minutes 10   Recumbant Bike   Level 3   RPM 45   Watts 30   Minutes 15   NuStep   Level 3   Watts 30   Minutes 15   Arm Ergometer   Level 1   Watts 8   Minutes 10   Arm/Foot Ergometer   Level 4   Watts 12   Minutes 10   Cybex   Level 3   RPM 60   Minutes 10   Recumbant Elliptical   Level 1   RPM 40   Watts 10   Minutes 10   Elliptical   Level 1   Speed 3   Minutes 1   REL-XR   Level 3   Watts 40   Minutes 15   T5 Nustep   Level 2   Watts 20   Minutes 15   Biostep-RELP   Level 3   Watts 20   Minutes 15   Prescription Details   Frequency (times per week) 3   Duration Progress to 30 minutes of continuous aerobic without signs/symptoms of physical distress   Intensity   THRR REST +  30   Ratings of Perceived Exertion 11-15   Progression   Progression Continue progressive overload as per policy without signs/symptoms or physical distress.   Resistance Training   Training Prescription Yes   Weight 2   Reps 10-15      Discharge Exercise Prescription (Final Exercise Prescription Changes):     Exercise Prescription Changes - 04/27/15 1200    Exercise Review   Progression No  Has not started program; med review was 01/19/15      Functional Capacity:     6 Minute Walk      01/19/15 1507       6 Minute Walk   Phase Initial      Distance 1270 feet     Walk Time 6 minutes     RPE 7     Symptoms No     Resting HR 84 bpm     Resting BP 110/62 mmHg     Max Ex. HR 90 bpm     Max Ex. BP 122/60 mmHg        Psychological, QOL, Others - Outcomes: PHQ 2/9: Depression screen PHQ 2/9 01/19/2015  Decreased Interest 1  Down, Depressed, Hopeless 0  PHQ - 2 Score 1  Altered sleeping 0  Tired, decreased energy 1  Change in appetite 1  Feeling bad or failure about yourself  0  Trouble concentrating 0  Moving slowly or fidgety/restless 0  Suicidal thoughts 0  PHQ-9 Score 3  Difficult doing work/chores Not difficult at all    Quality of Life:   Personal  Goals: Goals established at orientation with interventions provided to work toward goal.     Personal Goals and Risk Factors at Admission - 01/19/15 1508    Core Components/Risk Factors/Patient Goals on Admission    Weight Management Yes   Intervention (read-only) Learn and follow the exercise and diet guidelines while in the program. Utilize the nutrition and education classes to help gain knowledge of the diet and exercise expectations in the program   Admit Weight 219 lb 11.2 oz (99.655 kg)   Sedentary Yes   Diabetes Yes   Goal Blood glucose control identified by blood glucose values, HgbA1C. Participant verbalizes understanding of the signs/symptoms of hyper/hypo glycemia, proper foot care and importance of medication and nutrition plan for blood glucose control.   Intervention (read-only) Provide nutrition & aerobic exercise along with prescribed medications to achieve blood glucose in normal ranges: Fasting 65-99 mg/dL   Hypertension Yes   Goal Participant will see blood pressure controlled within the values of 140/66mm/Hg or within value directed by their physician.   Intervention (read-only) Provide nutrition & aerobic exercise along with prescribed medications to achieve BP 140/90 or less.   Lipids Yes   Goal Cholesterol controlled with medications as  prescribed, with individualized exercise RX and with personalized nutrition plan. Value goals: LDL < 70mg , HDL > 40mg . Participant states understanding of desired cholesterol values and following prescriptions.   Intervention (read-only) Provide nutrition & aerobic exercise along with prescribed medications to achieve LDL 70mg , HDL >40mg .   Stress Yes   Goal To meet with psychosocial counselor for stress and relaxation information and guidance. To state understanding of performing relaxation techniques and or identifying personal stressors.   Intervention (read-only) Provide education on types of stress, identifiying stressors, and ways to cope with stress. Provide demonstration and active practice of relaxation techniques.       Personal Goals Discharge:     Goals and Risk Factor Review      03/30/15 1122 05/01/15 1220         Core Components/Risk Factors/Patient Goals Review   Personal Goals Review Increase Aerobic Exercise and Physical Activity Sedentary      Review  I called Fayrene Helper and he said he is walking 5 miles per day 6-7 days/week. He said he is back at work "probably too much" so he won't be able to attend Cardiac Rehab.      Expected Outcomes  Cont to get aerobic exericse.       Increase Aerobic Exercise and Physical Activity (read-only)   Goals Progress/Improvement seen  No       Comments Ex. Rx. will be re-evaluated upon return due to extended absence          Nutrition & Weight - Outcomes:     Pre Biometrics - 01/19/15 1507    Pre Biometrics   Height 5' 8.9" (1.75 m)   Weight 219 lb 11.2 oz (99.655 kg)   Waist Circumference 42.5 inches   Hip Circumference 43.25 inches   Waist to Hip Ratio 0.98 %   BMI (Calculated) 32.6       Nutrition:     Nutrition Therapy & Goals - 01/19/15 1508    Nutrition Therapy   Diet diabetic diet   Drug/Food Interactions Statins/Certain Fruits   Intervention Plan   Intervention (read-only) Using nutrition plan and personal  goals to gain a healthy nutrition lifestyle. Add exercise as prescribed.      Nutrition Discharge:   Education Questionnaire Score:   Goals  reviewed with patient; copy given to patient.

## 2015-05-05 ENCOUNTER — Ambulatory Visit: Payer: BLUE CROSS/BLUE SHIELD

## 2015-05-07 ENCOUNTER — Ambulatory Visit: Payer: BLUE CROSS/BLUE SHIELD

## 2015-05-12 ENCOUNTER — Ambulatory Visit: Payer: BLUE CROSS/BLUE SHIELD

## 2015-05-14 ENCOUNTER — Ambulatory Visit: Payer: BLUE CROSS/BLUE SHIELD

## 2015-05-19 ENCOUNTER — Ambulatory Visit: Payer: BLUE CROSS/BLUE SHIELD

## 2016-04-01 ENCOUNTER — Encounter: Admission: RE | Payer: Self-pay | Source: Ambulatory Visit

## 2016-04-01 SURGERY — COLONOSCOPY WITH PROPOFOL
Anesthesia: General

## 2016-06-06 ENCOUNTER — Encounter: Admission: RE | Payer: Self-pay | Source: Ambulatory Visit

## 2016-06-06 ENCOUNTER — Ambulatory Visit
Admission: RE | Admit: 2016-06-06 | Payer: BLUE CROSS/BLUE SHIELD | Source: Ambulatory Visit | Admitting: Unknown Physician Specialty

## 2016-06-06 SURGERY — COLONOSCOPY WITH PROPOFOL
Anesthesia: General

## 2017-01-30 ENCOUNTER — Encounter: Admission: RE | Payer: Self-pay | Source: Ambulatory Visit

## 2017-01-30 ENCOUNTER — Ambulatory Visit
Admission: RE | Admit: 2017-01-30 | Payer: BLUE CROSS/BLUE SHIELD | Source: Ambulatory Visit | Admitting: Unknown Physician Specialty

## 2017-01-30 SURGERY — COLONOSCOPY WITH PROPOFOL
Anesthesia: General

## 2017-10-21 IMAGING — US US EXTREM LOW*L* LIMITED
1 series · 14 of 25 positions shown · non-contrast
Comparison: None.

CLINICAL DATA: Evaluate postoperative fluid collection in the
medial distal thigh. History of the vein harvest procedure on
12/05/2014.

EXAM:
ULTRASOUND left LOWER EXTREMITY LIMITED
TECHNIQUE: Ultrasound examination of the lower extremity soft tissues was
performed in the area of clinical concern.

[Series 1: us extrem low*left* limited · 0.07mm/px · 14 of 35 slices shown]
[im 1/35]
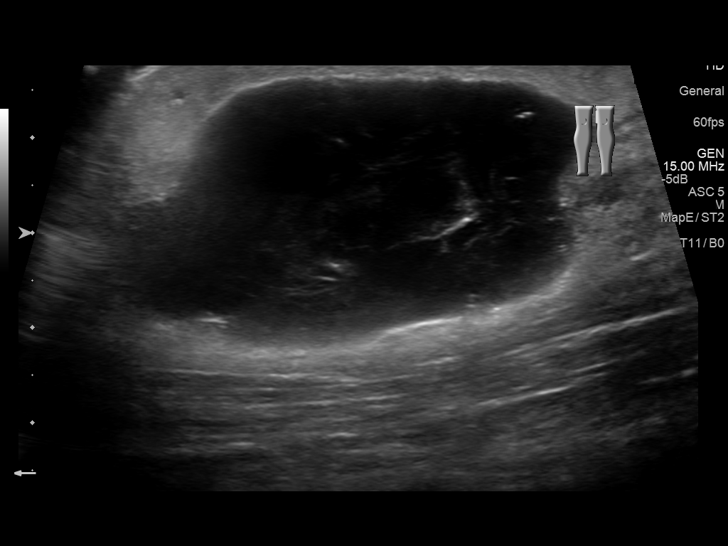
[im 3/35]
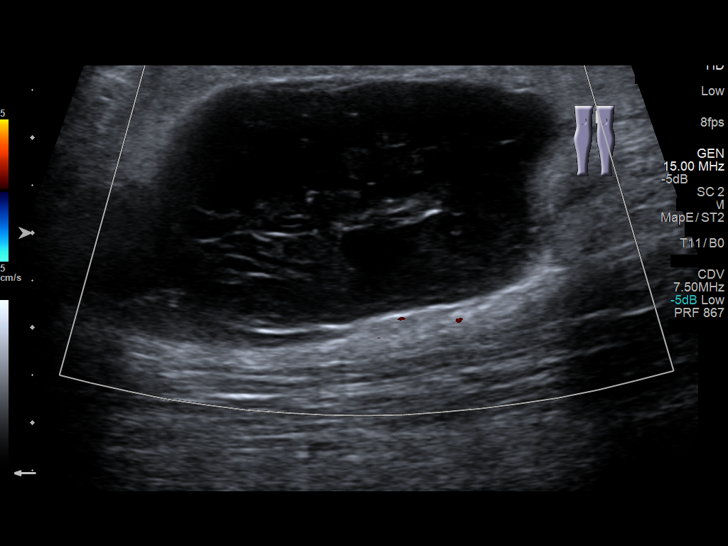
[im 6/35]
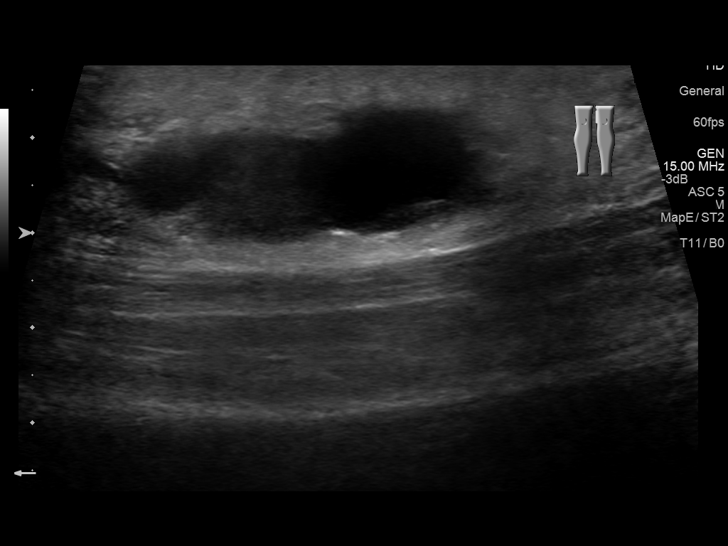
[im 9/35]
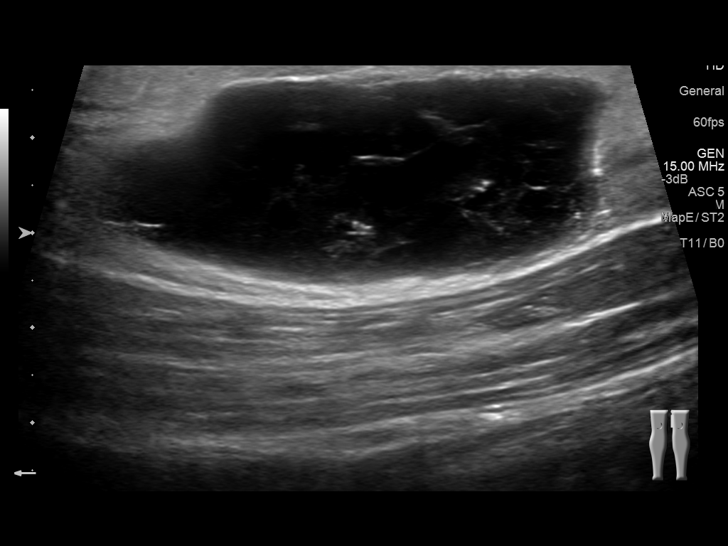
[im 12/35]
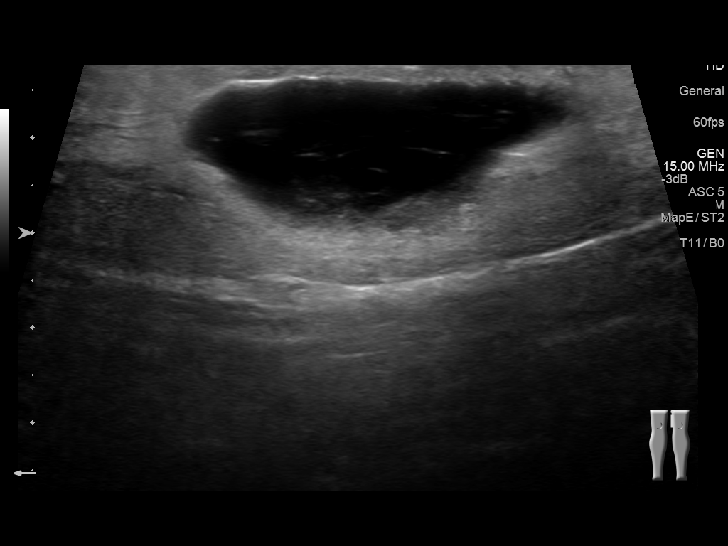
[im 13/35]
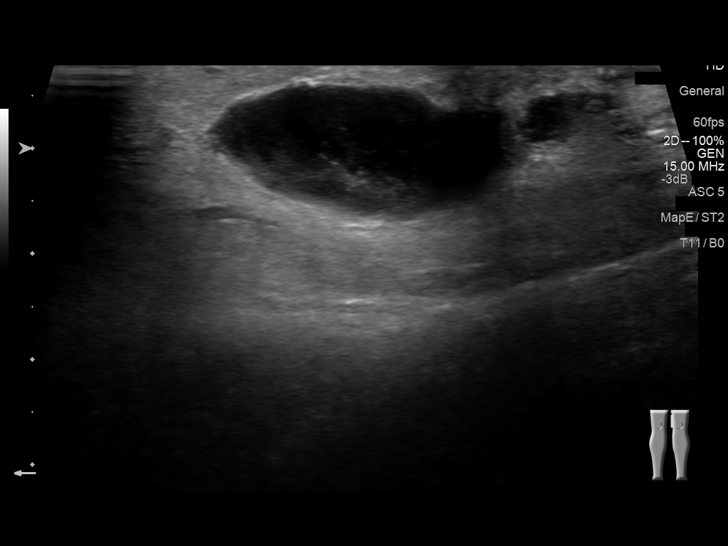
[im 16/35]
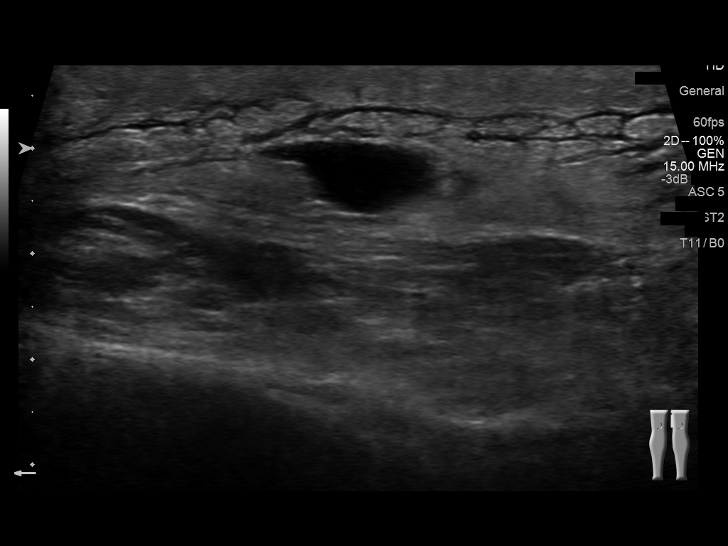
[im 19/35]
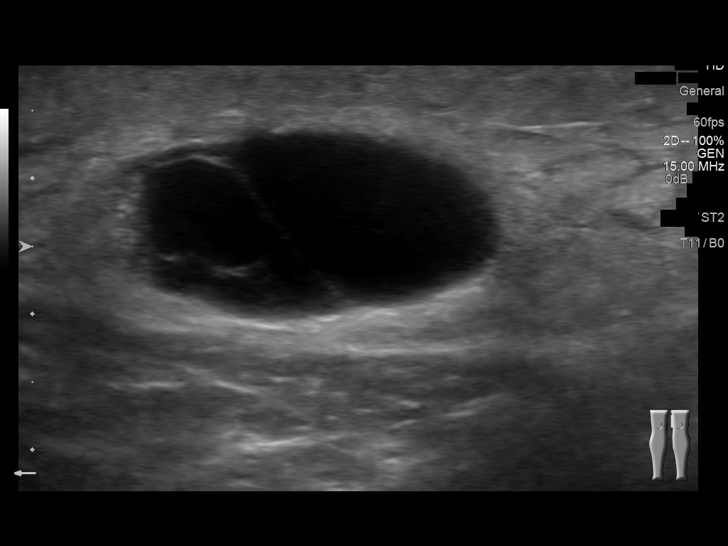
[im 22/35]
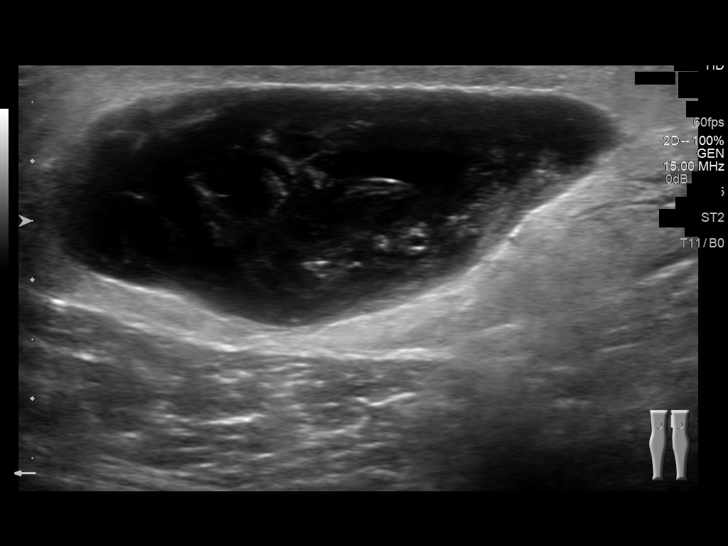
[im 23/35]
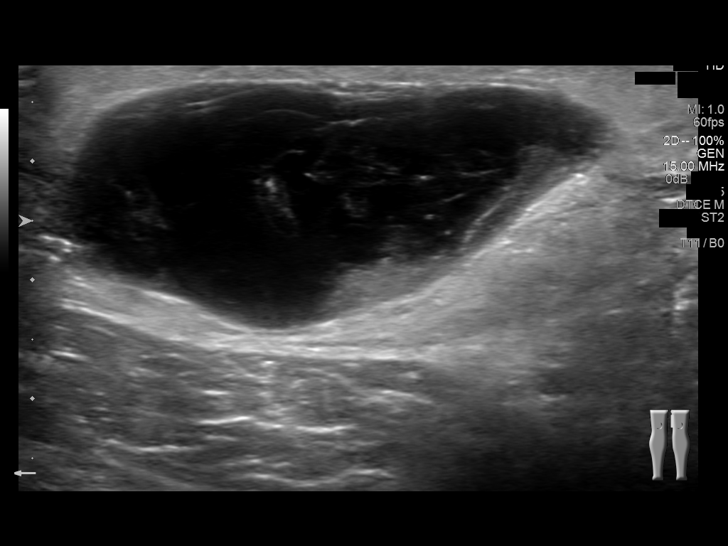
[im 26/35]
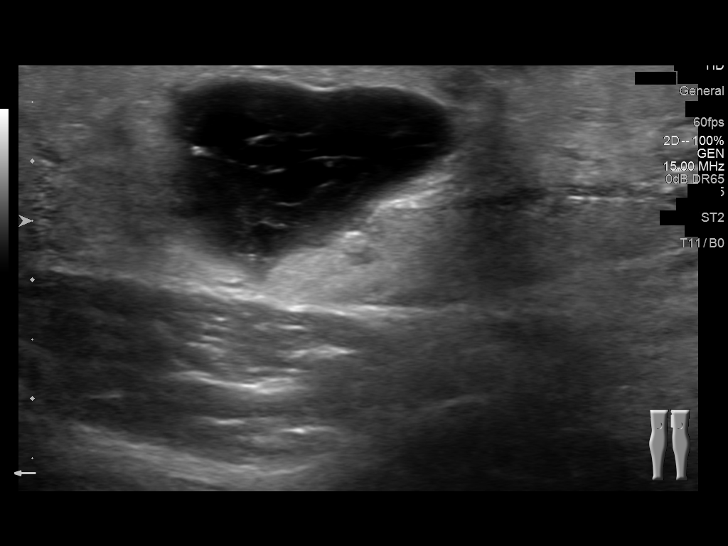
[im 29/35]
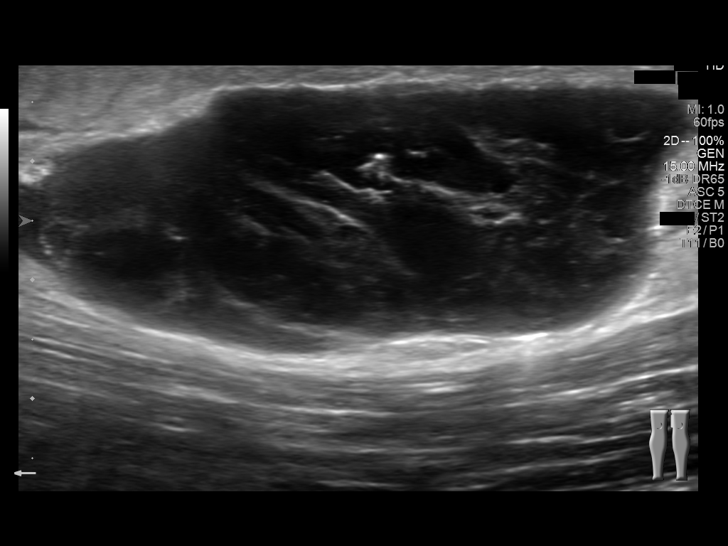
[im 32/35]
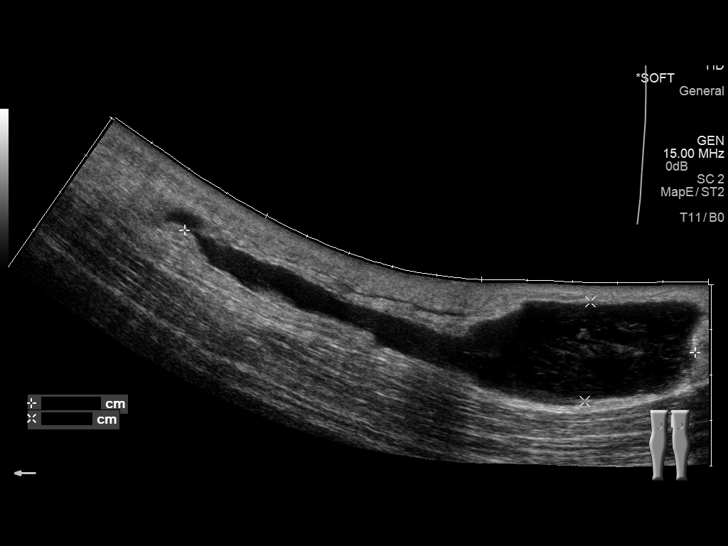
[im 35/35]
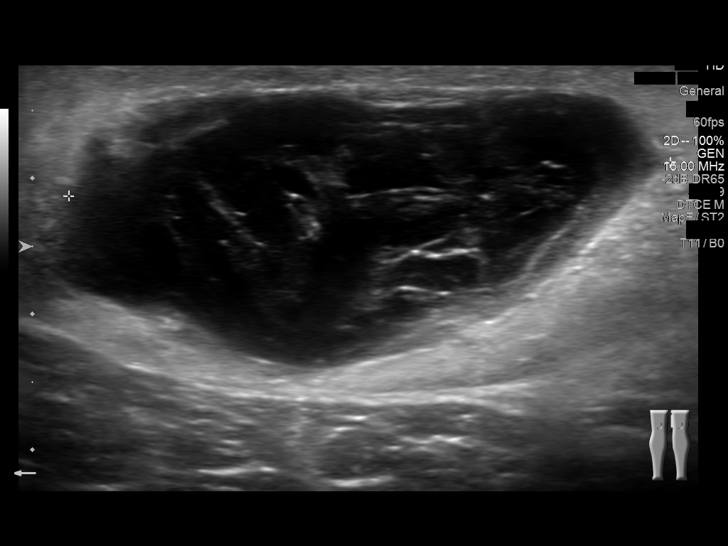

[14 of 25 positions shown; findings below may reference images not displayed]

FINDINGS: The patient's palpable abnormality corresponds to a complex fluid
collection in the subcutaneous fat superficial to the muscle fascia.
It measures approximately 4.3 x 2.0 cm but communicates with an
elongated tail of fluid measuring 11.5 mm. This is most consistent
with a liquified hematoma. Recommend clinical followup and reimaging
with MRI only if this does not resolve as it should clinically.
IMPRESSION: Complex fluid collection in the left medial distal thigh just above
the knee most consistent with a liquified hematoma. Recommend
clinical surveillance.

## 2018-03-09 ENCOUNTER — Encounter: Payer: Self-pay | Admitting: *Deleted

## 2018-03-12 ENCOUNTER — Encounter
Admission: RE | Disposition: A | Discharge status: Home / Self Care | Payer: Self-pay | Source: Home / Self Care | Attending: Unknown Physician Specialty

## 2018-03-12 ENCOUNTER — Ambulatory Visit: Payer: BLUE CROSS/BLUE SHIELD | Admitting: Anesthesiology

## 2018-03-12 ENCOUNTER — Ambulatory Visit
Admission: RE | Admit: 2018-03-12 | Discharge: 2018-03-12 | Disposition: A | Discharge status: Home / Self Care | Payer: BLUE CROSS/BLUE SHIELD | Attending: Unknown Physician Specialty | Admitting: Unknown Physician Specialty

## 2018-03-12 ENCOUNTER — Other Ambulatory Visit: Payer: Self-pay

## 2018-03-12 DIAGNOSIS — D120 Benign neoplasm of cecum: Secondary | ICD-10-CM | POA: Diagnosis not present

## 2018-03-12 DIAGNOSIS — Z1211 Encounter for screening for malignant neoplasm of colon: Secondary | ICD-10-CM | POA: Diagnosis not present

## 2018-03-12 DIAGNOSIS — E1140 Type 2 diabetes mellitus with diabetic neuropathy, unspecified: Secondary | ICD-10-CM | POA: Insufficient documentation

## 2018-03-12 DIAGNOSIS — I2510 Atherosclerotic heart disease of native coronary artery without angina pectoris: Secondary | ICD-10-CM | POA: Insufficient documentation

## 2018-03-12 DIAGNOSIS — Z79899 Other long term (current) drug therapy: Secondary | ICD-10-CM | POA: Insufficient documentation

## 2018-03-12 DIAGNOSIS — D128 Benign neoplasm of rectum: Secondary | ICD-10-CM | POA: Diagnosis not present

## 2018-03-12 DIAGNOSIS — D123 Benign neoplasm of transverse colon: Secondary | ICD-10-CM | POA: Insufficient documentation

## 2018-03-12 DIAGNOSIS — I10 Essential (primary) hypertension: Secondary | ICD-10-CM | POA: Insufficient documentation

## 2018-03-12 DIAGNOSIS — Z6836 Body mass index (BMI) 36.0-36.9, adult: Secondary | ICD-10-CM | POA: Diagnosis not present

## 2018-03-12 DIAGNOSIS — E11319 Type 2 diabetes mellitus with unspecified diabetic retinopathy without macular edema: Secondary | ICD-10-CM | POA: Diagnosis not present

## 2018-03-12 DIAGNOSIS — D122 Benign neoplasm of ascending colon: Secondary | ICD-10-CM | POA: Diagnosis not present

## 2018-03-12 DIAGNOSIS — Z794 Long term (current) use of insulin: Secondary | ICD-10-CM | POA: Diagnosis not present

## 2018-03-12 DIAGNOSIS — E785 Hyperlipidemia, unspecified: Secondary | ICD-10-CM | POA: Diagnosis not present

## 2018-03-12 DIAGNOSIS — Z7982 Long term (current) use of aspirin: Secondary | ICD-10-CM | POA: Diagnosis not present

## 2018-03-12 DIAGNOSIS — Z951 Presence of aortocoronary bypass graft: Secondary | ICD-10-CM | POA: Diagnosis not present

## 2018-03-12 DIAGNOSIS — E669 Obesity, unspecified: Secondary | ICD-10-CM | POA: Diagnosis not present

## 2018-03-12 HISTORY — DX: Personal history of other specified conditions: Z87.898

## 2018-03-12 HISTORY — DX: Atherosclerotic heart disease of native coronary artery without angina pectoris: I25.10

## 2018-03-12 HISTORY — DX: Type 2 diabetes mellitus with diabetic neuropathy, unspecified: E11.40

## 2018-03-12 HISTORY — DX: Deficiency of other specified B group vitamins: E53.8

## 2018-03-12 HISTORY — PX: COLONOSCOPY WITH PROPOFOL: SHX5780

## 2018-03-12 HISTORY — DX: Proteinuria, unspecified: R80.9

## 2018-03-12 LAB — GLUCOSE, CAPILLARY: GLUCOSE-CAPILLARY: 154 mg/dL — AB (ref 70–99)

## 2018-03-12 SURGERY — COLONOSCOPY WITH PROPOFOL
Anesthesia: General

## 2018-03-12 MED ORDER — MIDAZOLAM HCL 2 MG/2ML IJ SOLN
INTRAMUSCULAR | Status: DC | PRN
  Administered 2018-03-12: 2 mg via INTRAVENOUS

## 2018-03-12 MED ORDER — SODIUM CHLORIDE 0.9 % IV SOLN: INTRAVENOUS | Status: DC

## 2018-03-12 MED ORDER — FENTANYL CITRATE (PF) 100 MCG/2ML IJ SOLN
INTRAMUSCULAR | Status: AC
  Filled 2018-03-12: qty 2

## 2018-03-12 MED ORDER — SODIUM CHLORIDE 0.9 % IV SOLN
INTRAVENOUS | Status: DC
  Administered 2018-03-12: 07:00:00 via INTRAVENOUS

## 2018-03-12 MED ORDER — MIDAZOLAM HCL 2 MG/2ML IJ SOLN
INTRAMUSCULAR | Status: AC
  Filled 2018-03-12: qty 2

## 2018-03-12 MED ORDER — PROPOFOL 10 MG/ML IV BOLUS
INTRAVENOUS | Status: AC
  Filled 2018-03-12: qty 20

## 2018-03-12 MED ORDER — MIDAZOLAM HCL 2 MG/2ML IJ SOLN: INTRAMUSCULAR | Status: DC | PRN

## 2018-03-12 MED ORDER — EPHEDRINE SULFATE 50 MG/ML IJ SOLN
INTRAMUSCULAR | Status: AC
  Filled 2018-03-12: qty 1

## 2018-03-12 MED ORDER — PROPOFOL 500 MG/50ML IV EMUL
INTRAVENOUS | Status: DC | PRN
  Administered 2018-03-12: 120 ug/kg/min via INTRAVENOUS

## 2018-03-12 MED ORDER — FENTANYL CITRATE (PF) 100 MCG/2ML IJ SOLN
INTRAMUSCULAR | Status: DC | PRN
  Administered 2018-03-12: 25 ug via INTRAVENOUS
  Administered 2018-03-12: 50 ug via INTRAVENOUS
  Administered 2018-03-12: 25 ug via INTRAVENOUS

## 2018-03-12 MED ORDER — EPHEDRINE SULFATE 50 MG/ML IJ SOLN
INTRAMUSCULAR | Status: DC | PRN
  Administered 2018-03-12: 10 mg via INTRAVENOUS

## 2018-03-12 MED ORDER — PROPOFOL 500 MG/50ML IV EMUL
INTRAVENOUS | Status: AC
  Filled 2018-03-12: qty 50

## 2018-03-12 NOTE — Anesthesia Post-op Follow-up Note (Signed)
Anesthesia QCDR form completed.        

## 2018-03-12 NOTE — Op Note (Signed)
[facility] Gastroenterology Patient Name: Ronnie Morales Procedure Date: 03/12/2018 7:37 AM MRN: [id number] Account #: [id number] Date of Birth: [birth year] Admit Type: Outpatient Age: 58 Room: [facility] ENDO ROOM 1 Gender: Male Note Status: Finalized Procedure:            Colonoscopy Indications:          Screening for colorectal malignant neoplasm Providers:            Manya Silvas, MD Referring MD:         Christena Flake. Raechel Ache, MD (Referring MD) Medicines:            Propofol per Anesthesia Complications:        No immediate complications. Procedure:            Pre-Anesthesia Assessment:                       - After reviewing the risks and benefits, the patient                        was deemed in satisfactory condition to undergo the                        procedure.                       After obtaining informed consent, the colonoscope was                        passed under direct vision. Throughout the procedure,                        the patient's blood pressure, pulse, and oxygen                        saturations were monitored continuously. The                        Colonoscope was introduced through the anus and                        advanced to the the cecum, identified by appendiceal                        orifice and ileocecal valve. The colonoscopy was                        somewhat difficult due to Prep was fair at best. Lavage                        of 2L needed for washing to see the colon wall. Findings:      A medium polyp was found in the transverse colon. The polyp was sessile.       The polyp was removed with a hot snare. Resection and retrieval were       complete. One clip placed to lessen the chance of bleeding. Label Bottle       one      A small polyp was found in the cecum. The polyp was sessile. The polyp       was removed with a hot snare. Resection and retrieval were complete.  label bottle 2      A small polyp was found  in the ascending colon. The polyp was sessile.       The polyp was removed with a hot snare. Resection and retrieval were       complete. Label bottle 3      A small polyp was found in the transverse colon. The polyp was sessile.       The polyp was removed with a hot snare. Resection and retrieval were       complete. 2 polyps placed in this bottle. Label bottle 4      A diminutive polyp was found in the rectum. The polyp was sessile. The       polyp was removed with a hot snare. Resection and retrieval were       complete. Label bottle 5. Impression:           - One medium polyp in the transverse colon, removed                        with a hot snare. Resected and retrieved.                       - One small polyp in the cecum, removed with a hot                        snare. Resected and retrieved.                       - One small polyp in the ascending colon, removed with                        a hot snare. Resected and retrieved.                       - One small polyp in the transverse colon, removed with                        a hot snare. Resected and retrieved.                       - One diminutive polyp in the rectum, removed with a                        hot snare. Resected and retrieved. Recommendation:       - Await pathology results. Manya Silvas, MD 03/12/2018 8:49:40 AM This report has been signed electronically. Number of Addenda: 0 Note Initiated On: 03/12/2018 7:37 AM Scope Withdrawal Time: 0 hours 22 minutes 20 seconds  Total Procedure Duration: 0 hours 49 minutes 50 seconds       [facility]

## 2018-03-12 NOTE — Transfer of Care (Signed)
Immediate Anesthesia Transfer of Care Note  Patient: Ronnie Morales  Procedure(s) Performed: COLONOSCOPY WITH PROPOFOL (N/A )  Patient Location: PACU  Anesthesia Type:General  Level of Consciousness: awake and sedated  Airway & Oxygen Therapy: Patient Spontanous Breathing and Patient connected to face mask oxygen  Post-op Assessment: Report given to RN and Post -op Vital signs reviewed and stable  Post vital signs: Reviewed and stable  Last Vitals:  Vitals Value Taken Time  BP    Temp    Pulse    Resp    SpO2      Last Pain:  Vitals:   03/12/18 0718  TempSrc: Tympanic         Complications: No apparent anesthesia complications

## 2018-03-12 NOTE — Anesthesia Procedure Notes (Signed)
Performed by: Vaughan Sine Pre-anesthesia Checklist: Patient identified, Emergency Drugs available, Suction available, Patient being monitored and Timeout performed Patient Re-evaluated:Patient Re-evaluated prior to induction Oxygen Delivery Method: Simple face mask Preoxygenation: Pre-oxygenation with 100% oxygen Induction Type: IV induction Ventilation: Oral airway inserted - appropriate to patient size and Nasal airway inserted- appropriate to patient size Placement Confirmation: positive ETCO2 and CO2 detector Difficulty Due To: Difficulty was unanticipated, Difficult Airway- due to limited oral opening and Difficult Airway- due to reduced neck mobility

## 2018-03-12 NOTE — H&P (Signed)
Primary Care Physician:  Ezequiel Kayser, MD Primary Gastroenterologist:  Dr. Vira Agar  Pre-Procedure History & Physical: HPI:  Ronnie Morales is a 58 y.o. male is here for an colonoscopy.   Past Medical History:  Diagnosis Date  . Coronary artery disease   . Depression   . Deviated septum   . Diabetes mellitus without complication ([place])   . Diabetic neuropathy ([place])   . Diabetic neuropathy associated with type 2 diabetes mellitus ([place])   . Diabetic retinopathy ([place])   . Erectile dysfunction   . History of motion sickness   . Hyperlipidemia   . Hypertension   . Hypogonadism in male   . Microalbuminuria   . Microalbuminuria due to type 2 diabetes mellitus ([place])   . Obesity   . Vitamin B 12 deficiency     Past Surgical History:  Procedure Laterality Date  . CARDIAC CATHETERIZATION N/A 11/21/2014   Procedure: Left Heart Cath and Coronary Angiography;  Surgeon: Teodoro Spray, MD;  Location: [place] [facility];  Service: Cardiovascular;  Laterality: N/A;  . CHOLECYSTECTOMY    . CORONARY ANGIOPLASTY    . CORONARY ARTERY BYPASS GRAFT  03/06/2014   x 3  . EYE SURGERY    . TONSILLECTOMY      Prior to Admission medications   Medication Sig Start Date End Date Taking? Authorizing Provider  carvedilol (COREG) 6.25 MG tablet Take 6.25 mg by mouth 2 (two) times daily with a meal.   Yes [provider]  fluticasone (FLONASE) 50 MCG/ACT nasal spray Place 1 spray into both nostrils daily. As needed for rhinitis   Yes [provider]  insulin detemir (LEVEMIR) 100 UNIT/ML injection Inject into the skin at bedtime. 60 units sub q nightly   Yes [provider]  metFORMIN (GLUCOPHAGE) 500 MG tablet Take 500 mg by mouth 2 (two) times daily with a meal.   Yes [provider]  simvastatin (ZOCOR) 40 MG tablet Take 40 mg by mouth daily.   Yes [provider]  vitamin B-12 (CYANOCOBALAMIN) 1000 MCG tablet Take 1,000 mcg by mouth daily.   Yes  [provider]  aspirin 81 MG chewable tablet Chew by mouth.    [provider]  aspirin 81 MG tablet Take 81 mg by mouth daily.    [provider]  atorvastatin (LIPITOR) 40 MG tablet Take by mouth. 12/15/14 12/15/15  [provider]  furosemide (LASIX) 40 MG tablet Take by mouth.    [provider]  insulin aspart protamine- aspart (NOVOLOG MIX 70/30) (70-30) 100 UNIT/ML injection Inject into the skin. 14-18 units sub q 3 times daily with meals    [provider]  lisinopril (PRINIVIL,ZESTRIL) 10 MG tablet Take 10 mg by mouth daily.    [provider]  potassium chloride SA (K-DUR,KLOR-CON) 20 MEQ tablet Take by mouth.    [provider]    Allergies as of 01/05/2018  . (No Known Allergies)    History reviewed. No pertinent family history.  Social History   Socioeconomic History  . Marital status: Married    Spouse name: Not on file  . Number of children: Not on file  . Years of education: Not on file  . Highest education level: Not on file  Occupational History  . Not on file  Social Needs  . Financial resource strain: Not on file  . Food insecurity:    Worry: Not on file    Inability: Not on file  .  Transportation needs:    Medical: Not on file    Non-medical: Not on file  Tobacco Use  . Smoking status: Never Smoker  . Smokeless tobacco: Current User    Types: Chew  Substance and Sexual Activity  . Alcohol use: Yes    Alcohol/week: 2.0 standard drinks    Types: 2 Cans of beer per week    Comment: rarely  . Drug use: No  . Sexual activity: Not on file  Lifestyle  . Physical activity:    Days per week: Not on file    Minutes per session: Not on file  . Stress: Not on file  Relationships  . Social connections:    Talks on phone: Not on file    Gets together: Not on file    Attends religious service: Not on file    Active member of club or organization: Not on file    Attends meetings of  clubs or organizations: Not on file    Relationship status: Not on file  . Intimate partner violence:    Fear of current or ex partner: Not on file    Emotionally abused: Not on file    Physically abused: Not on file    Forced sexual activity: Not on file  Other Topics Concern  . Not on file  Social History Narrative  . Not on file    Review of Systems: See HPI, otherwise negative ROS  Physical Exam: BP (!) 154/76   Pulse 69   Temp (!) 96.6 F (35.9 C) (Tympanic)   Resp 18   Ht 5\' 8"  (1.727 m)   Wt 108.9 kg   SpO2 99%   BMI 36.49 kg/m  General:   Alert,  pleasant and cooperative in NAD Head:  Normocephalic and atraumatic. Neck:  Supple; no masses or thyromegaly. Lungs:  Clear throughout to auscultation.    Heart:  Regular rate and rhythm. Abdomen:  Soft, nontender and nondistended. Normal bowel sounds, without guarding, and without rebound.   Neurologic:  Alert and  oriented x4;  grossly normal neurologically.  Impression/Plan: Ronnie Morales is here for an colonoscopy to be performed for a  First time screening colonoscopy.  Risks, benefits, limitations, and alternatives regarding  colonoscopy have been reviewed with the patient.  Questions have been answered.  All parties agreeable.   Gaylyn Cheers, MD  03/12/2018, 7:31 AM

## 2018-03-12 NOTE — Anesthesia Preprocedure Evaluation (Addendum)
Anesthesia Evaluation  Patient identified by MRN, date of birth, ID band Patient awake    Reviewed: Allergy & Precautions, NPO status , Patient's Chart, lab work & pertinent test results, reviewed documented beta blocker date and time   Airway Mallampati: III  TM Distance: >3 FB     Dental  (+) Chipped   Pulmonary           Cardiovascular hypertension, Pt. on medications and Pt. on home beta blockers + CAD and + CABG       Neuro/Psych PSYCHIATRIC DISORDERS Depression    GI/Hepatic   Endo/Other  diabetes, Type 2  Renal/GU Renal disease     Musculoskeletal   Abdominal   Peds  Hematology   Anesthesia Other Findings Denies motion sickness. OBESE. No cardiac stentss. CABG  Reproductive/Obstetrics                            Anesthesia Physical Anesthesia Plan  ASA: III  Anesthesia Plan: General   Post-op Pain Management:    Induction: Intravenous  PONV Risk Score and Plan:   Airway Management Planned:   Additional Equipment:   Intra-op Plan:   Post-operative Plan:   Informed Consent: I have reviewed the patients History and Physical, chart, labs and discussed the procedure including the risks, benefits and alternatives for the proposed anesthesia with the patient or authorized representative who has indicated his/her understanding and acceptance.       Plan Discussed with: CRNA  Anesthesia Plan Comments:         Anesthesia Quick Evaluation

## 2018-03-12 NOTE — Anesthesia Postprocedure Evaluation (Signed)
Anesthesia Post Note  Patient: Ronnie Morales  Procedure(s) Performed: COLONOSCOPY WITH PROPOFOL (N/A )  Patient location during evaluation: Endoscopy Anesthesia Type: General Level of consciousness: awake and alert Pain management: pain level controlled Vital Signs Assessment: post-procedure vital signs reviewed and stable Respiratory status: spontaneous breathing, nonlabored ventilation, respiratory function stable and patient connected to nasal cannula oxygen Cardiovascular status: blood pressure returned to baseline and stable Postop Assessment: no apparent nausea or vomiting Anesthetic complications: no     Last Vitals:  Vitals:   03/12/18 0900 03/12/18 0910  BP: (!) 145/72 (!) 128/112  Pulse: 66 65  Resp: 14 (!) 7  Temp:    SpO2: 100% 99%    Last Pain:  Vitals:   03/12/18 0840  TempSrc: Tympanic                 , S

## 2018-03-13 LAB — SURGICAL PATHOLOGY

## 2018-03-14 ENCOUNTER — Encounter: Payer: Self-pay | Admitting: Unknown Physician Specialty

## 2018-05-16 ENCOUNTER — Other Ambulatory Visit: Payer: Self-pay | Admitting: Surgery

## 2018-05-16 DIAGNOSIS — M7502 Adhesive capsulitis of left shoulder: Secondary | ICD-10-CM

## 2018-05-16 DIAGNOSIS — M7582 Other shoulder lesions, left shoulder: Secondary | ICD-10-CM

## 2019-05-09 ENCOUNTER — Telehealth (HOSPITAL_COMMUNITY): Payer: Self-pay | Admitting: *Deleted

## 2019-05-09 ENCOUNTER — Encounter: Payer: Self-pay | Admitting: Emergency Medicine

## 2019-05-09 ENCOUNTER — Other Ambulatory Visit: Payer: Self-pay

## 2019-05-09 ENCOUNTER — Ambulatory Visit
Admission: EM | Admit: 2019-05-09 | Discharge: 2019-05-09 | Disposition: A | Discharge status: Home / Self Care | Payer: BC Managed Care – PPO | Attending: Family Medicine | Admitting: Family Medicine

## 2019-05-09 DIAGNOSIS — U071 COVID-19: Secondary | ICD-10-CM | POA: Diagnosis not present

## 2019-05-09 DIAGNOSIS — Z20822 Contact with and (suspected) exposure to covid-19: Secondary | ICD-10-CM | POA: Diagnosis not present

## 2019-05-09 LAB — SARS CORONAVIRUS 2 (TAT 6-24 HRS): SARS Coronavirus 2: POSITIVE — AB

## 2019-05-09 NOTE — Telephone Encounter (Signed)
Your test for COVID-19 was positive ("detected"), meaning that you were infected with the novel coronavirus and could give the germ to others.    Please continue isolation at home, for at least 10 days since the start of your fever/cough/breathlessness and until you have had 24 hours without fever (without taking a fever reducer) and with any cough/breathlessness improving. Use over-the-counter medications for symptoms.  If you have had no symptoms, but were exposed to someone who was positive for COVID-19, you will need to quarantine and self-isolate for 14 days from the date of exposure.    Please continue good preventive care measures, including:  frequent hand-washing, avoid touching your face, cover coughs/sneezes, stay out of crowds and keep a 6 foot distance from others.  Clean hard surfaces touched frequently with disinfectant cleaning products.   Please check in with your primary care provider about your positive test result.  Go to the nearest urgent care or ED for assessment if you have severe breathlessness or severe weakness/fatigue (ex needing new help getting out of bed or to the bathroom).  Members of your household will also need to quarantine for 14 days from the date of your positive test. You may be contacted to discuss possible treatment options, and you may also be contacted by the health department for follow up. Please call [place] at [phone] if you have any questions or concerns.

## 2019-05-09 NOTE — Discharge Instructions (Addendum)
You were tested today for COVID-19 due to your recent exposure with your positive Covid wife.  Because of your comorbidities you would qualify for infusion if your Covid test is positive.  If you notified of a positive test please contact our clinic for referral if you desire to undergo the IV treatment.  In the meantime you should remain quarantine until the results of the Covid test are available.  Your symptoms drink plenty of fluids get adequate rest take Tylenol or Motrin as necessary for aches and fever.  If you are positive you should notify your primary care physician.

## 2019-05-09 NOTE — ED Triage Notes (Signed)
Patient had positive COVID exposure to wife. He is c/o chills, generalized body aches. Denies cough, denies fever.

## 2019-05-09 NOTE — ED Provider Notes (Signed)
[facility]    CSN: [id number] Arrival date & time: 05/09/19  [id number]      History   Chief Complaint Chief Complaint  Patient presents with  . COVID testing  . Generalized Body Aches    HPI Ronnie Morales is a 59 y.o. male.   HPI  59 year old male presents after a positive exposure to his wife.  He states that [place] a [place] she was in [place] on a skiing trip.  Dates the following day she began to have symptoms and it was tested on Saturday with the report on Sunday that she had positive for Covid.  He was told to come here today 5 to 7 days following his exposure for a test on Covid.  He states that he has had chills and generalized body aches.  He has had no cough or had any fever.  He has had diarrhea for the last 3 days although this morning has not had any diarrhea.  He does not appear ill.  He is afebrile.  Pulse of 67 respirations 18 O2 sats 90% on room air.  Does have comorbidities of diabetes and his BMI is 36.34        Past Medical History:  Diagnosis Date  . Coronary artery disease   . Depression   . Deviated septum   . Diabetes mellitus without complication ([place])   . Diabetic neuropathy ([place])   . Diabetic neuropathy associated with type 2 diabetes mellitus ([place])   . Diabetic retinopathy ([place])   . Erectile dysfunction   . History of motion sickness   . Hyperlipidemia   . Hypertension   . Hypogonadism in male   . Microalbuminuria   . Microalbuminuria due to type 2 diabetes mellitus ([place])   . Obesity   . Vitamin B 12 deficiency     There are no problems to display for this patient.   Past Surgical History:  Procedure Laterality Date  . CARDIAC CATHETERIZATION N/A 11/21/2014   Procedure: Left Heart Cath and Coronary Angiography;  Surgeon: Teodoro Spray, MD;  Location: [place] [facility];  Service: Cardiovascular;  Laterality: N/A;  . CHOLECYSTECTOMY    . COLONOSCOPY WITH PROPOFOL N/A 03/12/2018   Procedure: COLONOSCOPY WITH PROPOFOL;  Surgeon: Manya Silvas, MD;  [facility] ENDOSCOPY;  Service: Endoscopy;  Laterality: N/A;  . CORONARY ANGIOPLASTY    . CORONARY ARTERY BYPASS GRAFT  03/06/2014   x 3  . EYE SURGERY    . TONSILLECTOMY         Home Medications    Prior to Admission medications   Medication Sig Start Date End Date Taking? Authorizing Provider  aspirin 81 MG chewable tablet Chew by mouth.   Yes [provider]  atorvastatin (LIPITOR) 40 MG tablet Take by mouth. 12/15/14 05/09/19 Yes [provider]  carvedilol (COREG) 6.25 MG tablet Take 6.25 mg by mouth 2 (two) times daily with a meal.   Yes [provider]  furosemide (LASIX) 40 MG tablet Take by mouth.   Yes [provider]  insulin aspart protamine- aspart (NOVOLOG MIX 70/30) (70-30) 100 UNIT/ML injection Inject into the skin. 14-18 units sub q 3 times daily with meals   Yes [provider]  insulin detemir (LEVEMIR) 100 UNIT/ML injection Inject into the skin at bedtime. 60 units sub q nightly   Yes [provider]  lisinopril (PRINIVIL,ZESTRIL) 10 MG tablet Take 10 mg by mouth daily.  Yes [provider]  metFORMIN (GLUCOPHAGE) 500 MG tablet Take 500 mg by mouth 2 (two) times daily with a meal.   Yes [provider]  simvastatin (ZOCOR) 40 MG tablet Take 40 mg by mouth daily.   Yes [provider]  vitamin B-12 (CYANOCOBALAMIN) 1000 MCG tablet Take 1,000 mcg by mouth daily.   Yes [provider]  aspirin 81 MG tablet Take 81 mg by mouth daily.    [provider]  fluticasone (FLONASE) 50 MCG/ACT nasal spray Place 1 spray into both nostrils daily. As needed for rhinitis  05/09/19  [provider]  potassium chloride SA (K-DUR,KLOR-CON) 20 MEQ tablet Take by mouth.  05/09/19  [provider]    Family History History reviewed. No pertinent family history.  Social History Social History    Tobacco Use  . Smoking status: Never Smoker  . Smokeless tobacco: Current User    Types: Chew  Substance Use Topics  . Alcohol use: Yes    Alcohol/week: 2.0 standard drinks    Types: 2 Cans of beer per week    Comment: rarely  . Drug use: No     Allergies   Patient has no known allergies.   Review of Systems Review of Systems  Constitutional: Positive for activity change, chills and fatigue. Negative for appetite change, diaphoresis and fever.  Respiratory: Negative for cough and shortness of breath.   Gastrointestinal: Positive for diarrhea. Negative for abdominal pain, constipation and nausea.  All other systems reviewed and are negative.    Physical Exam Triage Vital Signs ED Triage Vitals  Enc Vitals Group     BP 05/09/19 0854 (!) 104/58     Pulse Rate 05/09/19 0854 67     Resp 05/09/19 0854 18     Temp 05/09/19 0854 98.4 F (36.9 C)     Temp Source 05/09/19 0854 Oral     SpO2 05/09/19 0854 98 %     Weight 05/09/19 0852 239 lb (108.4 kg)     Height 05/09/19 0852 5\' 8"  (1.727 m)     Head Circumference --      Peak Flow --      Pain Score 05/09/19 0852 3     Pain Loc --      Pain Edu? --      Excl. in [place]? --    No data found.  Updated Vital Signs BP (!) 104/58 (BP Location: Right Arm)   Pulse 67   Temp 98.4 F (36.9 C) (Oral)   Resp 18   Ht 5\' 8"  (1.727 m)   Wt 239 lb (108.4 kg)   SpO2 98%   BMI 36.34 kg/m   Visual Acuity Right Eye Distance:   Left Eye Distance:   Bilateral Distance:    Right Eye Near:   Left Eye Near:    Bilateral Near:     Physical Exam Vitals and nursing note reviewed.  Constitutional:      General: He is not in acute distress.    Appearance: Normal appearance. He is obese. He is not ill-appearing or toxic-appearing.  HENT:     Head: Normocephalic and atraumatic.     Nose: Nose normal.  Eyes:     Conjunctiva/sclera: Conjunctivae normal.  Cardiovascular:     Rate and Rhythm: Regular rhythm.     Heart sounds:  Normal heart sounds.  Pulmonary:     Effort: Pulmonary effort is normal.     Breath sounds: Normal breath sounds.  Musculoskeletal:        General: Normal range of motion.     Cervical back: Normal range of motion and neck supple.  Skin:    General: Skin is warm and dry.  Neurological:     General: No focal deficit present.     Mental Status: He is alert and oriented to person, place, and time.  Psychiatric:        Mood and Affect: Mood normal.        Behavior: Behavior normal.        Thought Content: Thought content normal.        Judgment: Judgment normal.      UC Treatments / Results  Labs (all labs ordered are listed, but only abnormal results are displayed) Labs Reviewed  SARS CORONAVIRUS 2 (TAT 6-24 HRS)    EKG   Radiology No results found.  Procedures Procedures (including critical care time)  Medications Ordered in UC Medications - No data to display  Initial Impression / Assessment and Plan / UC Course  I have reviewed the triage vital signs and the nursing notes.  Pertinent labs & imaging results that were available during my care of the patient were reviewed by me and considered in my medical decision making (see chart for details).   59 year old male presented 5 to 7 days following exposure to his positive Covid wife.  He has had chills, generalized body aches, and diarrhea.  He denies any fever or cough.  Patient was tested for Covid.  Because of his comorbidities of diabetes hypertension and obesity he would qualify for infusion if he so desired.  If he does desire this he will contact our facility and we will arrange the infusion injections for him.  For his symptoms he should continue his hydration because of the diarrhea.  He was told to take Tylenol or Motrin as necessary for body aches or chills.  He should notify his primary care physician of the test results when received.  If he develops a severe shortness of breath or worsening of his symptoms she  should consider going to the emergency room   Final Clinical Impressions(s) / UC Diagnoses   Final diagnoses:  Contact with and (suspected) exposure to covid-19     Discharge Instructions     You were tested today for COVID-19 due to your recent exposure with your positive Covid wife.  Because of your comorbidities you would qualify for infusion if your Covid test is positive.  If you notified of a positive test please contact our clinic for referral if you desire to undergo the IV treatment.  In the meantime you should remain quarantine until the results of the Covid test are available.  Your symptoms drink plenty of fluids get adequate rest take Tylenol or Motrin as necessary for aches and fever.  If you are positive you should notify your primary care physician.    ED Prescriptions    None     PDMP not reviewed this encounter.   Lorin Picket, PA-C 05/09/19 [phone]

## 2019-05-10 ENCOUNTER — Telehealth: Payer: Self-pay | Admitting: Unknown Physician Specialty

## 2019-05-10 NOTE — Telephone Encounter (Signed)
  I connected by phone with Ronnie Morales on 05/10/2019 at 8:57 AM to discuss the potential use of an new treatment for mild to moderate COVID-19 viral infection in non-hospitalized patients.  This patient is a 59 y.o. male that meets the FDA criteria for Emergency Use Authorization of bamlanivimab or casirivimab\imdevimab.  Has a (+) direct SARS-CoV-2 viral test result  Has mild or moderate COVID-19   Is ? 57 years of age and weighs ? 40 kg  Is NOT hospitalized due to COVID-19  Is NOT requiring oxygen therapy or requiring an increase in baseline oxygen flow rate due to COVID-19  Is within 10 days of symptom onset  Has at least one of the high risk factor(s) for progression to severe COVID-19 and/or hospitalization as defined in EUA.  Specific high risk criteria : Diabetes and other co-morbid condtions.     I have spoken and communicated the following to the patient or parent/caregiver:  1. FDA has authorized the emergency use of bamlanivimab and casirivimab\imdevimab for the treatment of mild to moderate COVID-19 in adults and pediatric patients with positive results of direct SARS-CoV-2 viral testing who are 92 years of age and older weighing at least 40 kg, and who are at high risk for progressing to severe COVID-19 and/or hospitalization.  2. The significant known and potential risks and benefits of bamlanivimab and casirivimab\imdevimab, and the extent to which such potential risks and benefits are unknown.  3. Information on available alternative treatments and the risks and benefits of those alternatives, including clinical trials.  4. Patients treated with bamlanivimab and casirivimab\imdevimab should continue to self-isolate and use infection control measures (e.g., wear mask, isolate, social distance, avoid sharing personal items, clean and disinfect "high touch" surfaces, and frequent handwashing) according to CDC guidelines.   5. The patient or parent/caregiver has  the option to accept or refuse bamlanivimab or casirivimab\imdevimab .  After reviewing this information with the patient, the patient will think about getting the infusion and will call me back or [facility] after talking to doctor.    [name] 05/10/2019 8:57 AM

## 2021-12-17 ENCOUNTER — Ambulatory Visit
Admission: EM | Admit: 2021-12-17 | Discharge: 2021-12-17 | Disposition: A | Discharge status: Home / Self Care | Payer: BC Managed Care – PPO | Attending: Emergency Medicine | Admitting: Emergency Medicine

## 2021-12-17 ENCOUNTER — Ambulatory Visit (INDEPENDENT_AMBULATORY_CARE_PROVIDER_SITE_OTHER): Payer: BC Managed Care – PPO

## 2021-12-17 DIAGNOSIS — J22 Unspecified acute lower respiratory infection: Secondary | ICD-10-CM

## 2021-12-17 DIAGNOSIS — R059 Cough, unspecified: Secondary | ICD-10-CM

## 2021-12-17 DIAGNOSIS — R062 Wheezing: Secondary | ICD-10-CM

## 2021-12-17 MED ORDER — IPRATROPIUM BROMIDE 0.06 % NA SOLN: 2.0000 | Freq: Four times a day (QID) | NASAL | 0 refills | Status: DC

## 2021-12-17 MED ORDER — AMOXICILLIN-POT CLAVULANATE 875-125 MG PO TABS: 1.0000 | ORAL_TABLET | Freq: Two times a day (BID) | ORAL | 0 refills | Status: AC

## 2021-12-17 MED ORDER — ALBUTEROL SULFATE HFA 108 (90 BASE) MCG/ACT IN AERS: 1.0000 | INHALATION_SPRAY | RESPIRATORY_TRACT | 0 refills | Status: DC | PRN

## 2021-12-17 MED ORDER — AZITHROMYCIN 250 MG PO TABS: 250.0000 mg | ORAL_TABLET | Freq: Every day | ORAL | 0 refills | Status: DC

## 2021-12-17 MED ORDER — AEROCHAMBER MV MISC: 1 refills | Status: DC

## 2021-12-17 NOTE — Discharge Instructions (Signed)
Your x-ray was negative for pneumonia, however, given your recent [place] and duration of your symptoms, I am concerned that you could have a pneumonia.  I am going to treat you as if you have a pneumonia with Augmentin and azithromycin.  Atrovent nasal spray, 2 puffs from your albuterol inhaler using your spacer every 4-6 hours as needed for coughing, wheezing.  Saline nasal irrigation with a Milta Deiters Med rinse and distilled water as often as you want.

## 2021-12-17 NOTE — ED Triage Notes (Signed)
Pt states he has had a cold x10 days, cough, runny nose

## 2021-12-17 NOTE — ED Provider Notes (Signed)
HPI  SUBJECTIVE:  Ronnie Morales is a 61 y.o. male who presents with 10 days of chest congestion, nasal congestion, copious clear rhinorrhea, cough productive of mucus, fatigue, wheezing.  No fevers, sinus pain or pressure, postnasal drip, facial swelling, upper dental pain, shortness of breath, dyspnea on exertion, chest pain.  He is able to sleep at night with NyQuil.  No GERD symptoms.  No change in his baseline allergy symptoms of itchy, watery eyes.  He has tried cough syrup, cough drops, Tylenol, DayQuil, NyQuil.  The NyQuil helps.  No aggravating factors.  He has a past medical history of diabetes, high blood pressure, coronary artery disease status post cardiac catheterization and CABG.  No history of pulmonary disease, smoking.  [facility]   Past Medical History:  Diagnosis Date   Coronary artery disease    Depression    Deviated septum    Diabetes mellitus without complication ([place])    Diabetic neuropathy ([place])    Diabetic neuropathy associated with type 2 diabetes mellitus ([place])    Diabetic retinopathy ([place])    Erectile dysfunction    History of motion sickness    Hyperlipidemia    Hypertension    Hypogonadism in male    Microalbuminuria    Microalbuminuria due to type 2 diabetes mellitus ([place])    Obesity    Vitamin B 12 deficiency     Past Surgical History:  Procedure Laterality Date   CARDIAC CATHETERIZATION N/A 11/21/2014   Procedure: Left Heart Cath and Coronary Angiography;  Surgeon: Teodoro Spray, MD;  Location: [facility];  Service: Cardiovascular;  Laterality: N/A;   CHOLECYSTECTOMY     COLONOSCOPY WITH PROPOFOL N/A 03/12/2018   Procedure: COLONOSCOPY WITH PROPOFOL;  Surgeon: Manya Silvas, MD;  Location: [facility] ENDOSCOPY;  Service: Endoscopy;  Laterality: N/A;   CORONARY ANGIOPLASTY     CORONARY ARTERY BYPASS GRAFT  03/06/2014   x 3   EYE SURGERY     TONSILLECTOMY      History reviewed. No pertinent family history.  Social  History   Tobacco Use   Smoking status: Never   Smokeless tobacco: Current    Types: Chew  Vaping Use   Vaping Use: Never used  Substance Use Topics   Alcohol use: Yes    Alcohol/week: 2.0 standard drinks of alcohol    Types: 2 Cans of beer per week    Comment: rarely   Drug use: No    No current facility-administered medications for this encounter.  Current Outpatient Medications:    albuterol (VENTOLIN HFA) 108 (90 Base) MCG/ACT inhaler, Inhale 1-2 puffs into the lungs every 4 (four) hours as needed for wheezing or shortness of breath., Disp: 1 each, Rfl: 0   amoxicillin-clavulanate (AUGMENTIN) 875-125 MG tablet, Take 1 tablet by mouth every 12 (twelve) hours for 5 days., Disp: 10 tablet, Rfl: 0   aspirin 81 MG chewable tablet, Chew by mouth., Disp: , Rfl:    azithromycin (ZITHROMAX) 250 MG tablet, Take 1 tablet (250 mg total) by mouth daily. 2 tabs po on day 1, 1 tab po on days 2-5, Disp: 6 tablet, Rfl: 0   carvedilol (COREG) 6.25 MG tablet, Take 6.25 mg by mouth 2 (two) times daily with a meal., Disp: , Rfl:    furosemide (LASIX) 40 MG tablet, Take by mouth., Disp: , Rfl:    insulin aspart protamine- aspart (NOVOLOG MIX 70/30) (70-30) 100 UNIT/ML injection, Inject into the skin. 14-18 units sub q 3  times daily with meals, Disp: , Rfl:    insulin detemir (LEVEMIR) 100 UNIT/ML injection, Inject into the skin at bedtime. 60 units sub q nightly, Disp: , Rfl:    ipratropium (ATROVENT) 0.06 % nasal spray, Place 2 sprays into both nostrils 4 (four) times daily., Disp: 15 mL, Rfl: 0   lisinopril (PRINIVIL,ZESTRIL) 10 MG tablet, Take 10 mg by mouth daily., Disp: , Rfl:    metFORMIN (GLUCOPHAGE) 500 MG tablet, Take 500 mg by mouth 2 (two) times daily with a meal., Disp: , Rfl:    OZEMPIC, 0.25 OR 0.5 MG/DOSE, 2 MG/3ML SOPN, Apply topically., Disp: , Rfl:    simvastatin (ZOCOR) 40 MG tablet, Take 40 mg by mouth daily., Disp: , Rfl:    Spacer/Aero-Holding Chambers (AEROCHAMBER MV) inhaler,  Use as instructed, Disp: 1 each, Rfl: 1   vitamin B-12 (CYANOCOBALAMIN) 1000 MCG tablet, Take 1,000 mcg by mouth daily., Disp: , Rfl:    aspirin 81 MG tablet, Take 81 mg by mouth daily., Disp: , Rfl:    atorvastatin (LIPITOR) 40 MG tablet, Take by mouth., Disp: , Rfl:   No Known Allergies   ROS  As noted in HPI.   Physical Exam  BP (!) 147/75 (BP Location: Right Arm)   Pulse 69   Temp 97.9 F (36.6 C) (Oral)   Ht '5\' 8"'$  (1.727 m)   Wt 105.2 kg   SpO2 98%   BMI 35.28 kg/m   Constitutional: Well developed, well nourished, no acute distress Eyes:  EOMI, conjunctiva normal bilaterally HENT: Normocephalic, atraumatic,mucus membranes moist.  Clear nasal congestion.  Erythematous, but not swollen turbinates.  No maxillary, frontal sinus tenderness.   Respiratory: Normal inspiratory effort, wheezing right upper lobe. Cardiovascular: Normal rate, regular rhythm, no murmurs, rubs, gallops GI: nondistended skin: No rash, skin intact Musculoskeletal: no deformities Neurologic: Alert & oriented x 3, no focal neuro deficits Psychiatric: Speech and behavior appropriate   ED Course   Medications - No data to display  Orders Placed This Encounter  Procedures   DG Chest 2 View    Standing Status:   Standing    Number of Occurrences:   1    Order Specific Question:   Reason for Exam (SYMPTOM  OR DIAGNOSIS REQUIRED)    Answer:   Cough over 10 days's, wheezing right upper lobe rule out pneumonia    No results found for this or any previous visit (from the past 24 hour(s)). DG Chest 2 View  Result Date: 12/17/2021 CLINICAL DATA:  Cough for 10 days. Wheezing. Coronary artery disease. EXAM: CHEST - 2 VIEW COMPARISON:  None Available. FINDINGS: The heart size and mediastinal contours are within normal limits. Prior CABG noted. Both lungs are clear. The visualized skeletal structures are unremarkable. IMPRESSION: No active cardiopulmonary disease. Electronically Signed   By: Marlaine Hind  M.D.   On: 12/17/2021 15:52    ED Clinical Impression  1. Lower respiratory tract infection      ED Assessment/Plan     Checking chest x-ray as I am concerned that he may have secondary pneumonia.  There does not appear to be any evidence of sinusitis.  Did not check a COVID/flu as he is out of the treatment window for both.  Outside labs reviewed.  Calculated creatinine clearance from labs done in March 2023 79 mL/min.  Do not need to renally dose any of his medications.  Reviewed imaging independently.  No pneumonia.  See radiology report for full details.  Patient has  no evidence of pneumonia on x-ray, however, will treat as such given duration of illness and focal lung findings.  Home with Augmentin 875/125 mg twice daily for 5 days plus azithromycin Z-Pak due to history of chronic heart, kidney disease and diabetes.  Will send home with Atrovent nasal spray, and albuterol inhaler with a spacer 2 puffs every 4-6 hours as needed, may continue NyQuil.  Discussed imaging, MDM, treatment plan, and plan for follow-up with patient. Discussed sn/sx that should prompt return to the ED. patient agrees with plan.   Meds ordered this encounter  Medications   azithromycin (ZITHROMAX) 250 MG tablet    Sig: Take 1 tablet (250 mg total) by mouth daily. 2 tabs po on day 1, 1 tab po on days 2-5    Dispense:  6 tablet    Refill:  0   amoxicillin-clavulanate (AUGMENTIN) 875-125 MG tablet    Sig: Take 1 tablet by mouth every 12 (twelve) hours for 5 days.    Dispense:  10 tablet    Refill:  0   ipratropium (ATROVENT) 0.06 % nasal spray    Sig: Place 2 sprays into both nostrils 4 (four) times daily.    Dispense:  15 mL    Refill:  0   albuterol (VENTOLIN HFA) 108 (90 Base) MCG/ACT inhaler    Sig: Inhale 1-2 puffs into the lungs every 4 (four) hours as needed for wheezing or shortness of breath.    Dispense:  1 each    Refill:  0   Spacer/Aero-Holding Chambers (AEROCHAMBER MV) inhaler    Sig:  Use as instructed    Dispense:  1 each    Refill:  1      *This clinic note was created using Dragon dictation software. Therefore, there may be occasional mistakes despite careful proofreading.  ?    Melynda Ripple, MD 12/17/21 [phone]

## 2022-01-09 ENCOUNTER — Ambulatory Visit
Admission: EM | Admit: 2022-01-09 | Discharge: 2022-01-09 | Disposition: A | Discharge status: Home / Self Care | Payer: BC Managed Care – PPO | Attending: Family Medicine | Admitting: Family Medicine

## 2022-01-09 ENCOUNTER — Encounter: Payer: Self-pay | Admitting: Emergency Medicine

## 2022-01-09 DIAGNOSIS — Z79899 Other long term (current) drug therapy: Secondary | ICD-10-CM | POA: Diagnosis not present

## 2022-01-09 DIAGNOSIS — J069 Acute upper respiratory infection, unspecified: Secondary | ICD-10-CM

## 2022-01-09 DIAGNOSIS — R059 Cough, unspecified: Secondary | ICD-10-CM | POA: Diagnosis not present

## 2022-01-09 DIAGNOSIS — Z1152 Encounter for screening for COVID-19: Secondary | ICD-10-CM | POA: Insufficient documentation

## 2022-01-09 LAB — RESP PANEL BY RT-PCR (FLU A&B, COVID) ARPGX2
Influenza A by PCR: NEGATIVE
Influenza B by PCR: NEGATIVE
SARS Coronavirus 2 by RT PCR: NEGATIVE

## 2022-01-09 MED ORDER — PROMETHAZINE-DM 6.25-15 MG/5ML PO SYRP: 5.0000 mL | ORAL_SOLUTION | Freq: Four times a day (QID) | ORAL | 0 refills | Status: DC | PRN

## 2022-01-09 NOTE — Discharge Instructions (Signed)
Rest, fluids.  Medication as prescribed.  We will call if results are positive (and treat).  Take care  Dr. Lacinda Axon

## 2022-01-09 NOTE — ED Provider Notes (Signed)
[facility]    CSN: [id number] Arrival date & time: 01/09/22  1230      History   Chief Complaint Chief Complaint  Patient presents with   Cough    HPI 61 year old male presents for evaluation of the above.  Patient reports ongoing symptoms.  He was seen and treated on 11/3.  He was treated for lower respiratory tract infection with Augmentin and azithromycin.  He states that his symptoms improved and have returned as of the past 3 days.  He reports sore throat, fatigue, cough.  No fever.  No relief with over-the-counter treatment.  Past Medical History:  Diagnosis Date   Coronary artery disease    Depression    Deviated septum    Diabetes mellitus without complication ([place])    Diabetic neuropathy ([place])    Diabetic neuropathy associated with type 2 diabetes mellitus ([place])    Diabetic retinopathy ([place])    Erectile dysfunction    History of motion sickness    Hyperlipidemia    Hypertension    Hypogonadism in male    Microalbuminuria    Microalbuminuria due to type 2 diabetes mellitus ([place])    Obesity    Vitamin B 12 deficiency     There are no problems to display for this patient.   Past Surgical History:  Procedure Laterality Date   CARDIAC CATHETERIZATION N/A 11/21/2014   Procedure: Left Heart Cath and Coronary Angiography;  Surgeon: Teodoro Spray, MD;  Location: [facility];  Service: Cardiovascular;  Laterality: N/A;   CHOLECYSTECTOMY     COLONOSCOPY WITH PROPOFOL N/A 03/12/2018   Procedure: COLONOSCOPY WITH PROPOFOL;  Surgeon: Manya Silvas, MD;  [facility]: [facility] ENDOSCOPY;  Service: Endoscopy;  Laterality: N/A;   CORONARY ANGIOPLASTY     CORONARY ARTERY BYPASS GRAFT  03/06/2014   x 3   EYE SURGERY     TONSILLECTOMY         Home Medications    Prior to Admission medications   Medication Sig Start Date End Date Taking? Authorizing Provider  promethazine-dextromethorphan (PROMETHAZINE-DM) 6.25-15 MG/5ML syrup Take 5 mLs by mouth  4 (four) times daily as needed for cough. 01/09/22  Yes ,  G, DO  albuterol (VENTOLIN HFA) 108 (90 Base) MCG/ACT inhaler Inhale 1-2 puffs into the lungs every 4 (four) hours as needed for wheezing or shortness of breath. 12/17/21   Melynda Ripple, MD  aspirin 81 MG chewable tablet Chew by mouth.    [provider]  aspirin 81 MG tablet Take 81 mg by mouth daily.    [provider]  atorvastatin (LIPITOR) 40 MG tablet Take by mouth. 12/15/14 12/17/21  [provider]  azithromycin (ZITHROMAX) 250 MG tablet Take 1 tablet (250 mg total) by mouth daily. 2 tabs po on day 1, 1 tab po on days 2-5 12/17/21   Melynda Ripple, MD  carvedilol (COREG) 6.25 MG tablet Take 6.25 mg by mouth 2 (two) times daily with a meal.    [provider]  furosemide (LASIX) 40 MG tablet Take by mouth.    [provider]  insulin aspart protamine- aspart (NOVOLOG MIX 70/30) (70-30) 100 UNIT/ML injection Inject into the skin. 14-18 units sub q 3 times daily with meals    [provider]  insulin detemir (LEVEMIR) 100 UNIT/ML injection Inject into the skin at bedtime. 60 units sub q nightly    [provider]  ipratropium (ATROVENT) 0.06 % nasal spray Place 2 sprays into both nostrils  4 (four) times daily. 12/17/21   Melynda Ripple, MD  lisinopril (PRINIVIL,ZESTRIL) 10 MG tablet Take 10 mg by mouth daily.    [provider]  metFORMIN (GLUCOPHAGE) 500 MG tablet Take 500 mg by mouth 2 (two) times daily with a meal.    [provider]  OZEMPIC, 0.25 OR 0.5 MG/DOSE, 2 MG/3ML SOPN Apply topically. 08/03/21   [provider]  simvastatin (ZOCOR) 40 MG tablet Take 40 mg by mouth daily.    [provider]  Spacer/Aero-Holding Chambers (AEROCHAMBER MV) inhaler Use as instructed 12/17/21   Melynda Ripple, MD  vitamin B-12 (CYANOCOBALAMIN) 1000 MCG tablet Take 1,000 mcg by mouth daily.    [provider]  fluticasone  (FLONASE) 50 MCG/ACT nasal spray Place 1 spray into both nostrils daily. As needed for rhinitis  05/09/19  [provider]  potassium chloride SA (K-DUR,KLOR-CON) 20 MEQ tablet Take by mouth.  05/09/19  [provider]    Family History History reviewed. No pertinent family history.  Social History Social History   Tobacco Use   Smoking status: Never   Smokeless tobacco: Current    Types: Chew  Vaping Use   Vaping Use: Never used  Substance Use Topics   Alcohol use: Yes    Alcohol/week: 2.0 standard drinks of alcohol    Types: 2 Cans of beer per week    Comment: rarely   Drug use: No     Allergies   Patient has no known allergies.   Review of Systems Review of Systems Per HPI  Physical Exam Triage Vital Signs ED Triage Vitals  Enc Vitals Group     BP 01/09/22 1258 124/62     Pulse Rate 01/09/22 1258 69     Resp 01/09/22 1258 15     Temp 01/09/22 1258 98.5 F (36.9 C)     Temp Source 01/09/22 1258 Oral     SpO2 01/09/22 1258 100 %     Weight 01/09/22 1257 231 lb 14.8 oz (105.2 kg)     Height 01/09/22 1257 '5\' 8"'$  (1.727 m)     Head Circumference --      Peak Flow --      Pain Score 01/09/22 1257 0     Pain Loc --      Pain Edu? --      Excl. in [place]? --    No data found.  Updated Vital Signs BP 124/62 (BP Location: Left Arm)   Pulse 69   Temp 98.5 F (36.9 C) (Oral)   Resp 15   Ht '5\' 8"'$  (1.727 m)   Wt 105.2 kg   SpO2 100%   BMI 35.26 kg/m   Visual Acuity Right Eye Distance:   Left Eye Distance:   Bilateral Distance:    Right Eye Near:   Left Eye Near:    Bilateral Near:     Physical Exam Constitutional:      General: He is not in acute distress.    Appearance: Normal appearance. He is obese.  HENT:     Head: Normocephalic and atraumatic.  Cardiovascular:     Rate and Rhythm: Normal rate and regular rhythm.  Pulmonary:     Effort: Pulmonary effort is normal.     Breath sounds: Normal breath sounds. No wheezing, rhonchi or  rales.  Neurological:     Mental Status: He is alert.  Psychiatric:        Mood and Affect: Mood normal.  Behavior: Behavior normal.      UC Treatments / Results  Labs (all labs ordered are listed, but only abnormal results are displayed) Labs Reviewed  RESP PANEL BY RT-PCR (FLU A&B, COVID) [id number]    EKG   Radiology No results found.  Procedures Procedures (including critical care time)  Medications Ordered in UC Medications - No data to display  Initial Impression / Assessment and Plan / UC Course  I have reviewed the triage vital signs and the nursing notes.  Pertinent labs & imaging results that were available during my care of the patient were reviewed by me and considered in my medical decision making (see chart for details).    61 year old male presents with suspected viral illness.  He has recently been treated with 2 antibiotics.  I sincerely doubt that this is bacterial in origin at this time.  COVID and flu testing negative.  Promethazine DM for cough.  Supportive care.  Final Clinical Impressions(s) / UC Diagnoses   Final diagnoses:  Viral URI with cough     Discharge Instructions      Rest, fluids.  Medication as prescribed.  We will call if results are positive (and treat).  Take care  Dr. Lacinda Axon    ED Prescriptions     Medication Sig Dispense Auth. Provider   promethazine-dextromethorphan (PROMETHAZINE-DM) 6.25-15 MG/5ML syrup Take 5 mLs by mouth 4 (four) times daily as needed for cough. 118 mL Coral Spikes, DO      PDMP not reviewed this encounter.   Coral Spikes, [place] 01/09/22 1538

## 2022-01-09 NOTE — ED Triage Notes (Signed)
Patient was seen on 12/17/21 for a URI symptoms.  Patient states that he finished his antibiotic.  Patient states that his symptoms of cough and congestion returned earlier last week.  Patient denies fevers.

## 2022-09-20 ENCOUNTER — Encounter: Payer: Self-pay | Admitting: Ophthalmology

## 2022-09-26 NOTE — Discharge Instructions (Signed)

## 2022-09-27 NOTE — Anesthesia Preprocedure Evaluation (Addendum)
Anesthesia Evaluation  Patient identified by MRN, date of birth, ID band Patient awake    Reviewed: Allergy & Precautions, H&P , NPO status , Patient's Chart, lab work & pertinent test results  Airway Mallampati: IV  TM Distance: >3 FB Neck ROM: Full  Mouth opening: Limited Mouth Opening Comment: Thick neck Based on Mallampati score, thick neck, redundant tissue, would be concerned with probable difficult intubation if this were necessary. Dental no notable dental hx.    Pulmonary neg pulmonary ROS   Pulmonary exam normal breath sounds clear to auscultation       Cardiovascular Exercise Tolerance: Good hypertension, Pt. on home beta blockers and Pt. on medications + CAD  negative cardio ROS Normal cardiovascular exam Rhythm:Regular Rate:Normal  CAD, Cardiac catheterization; coronary artery bypass w/venous & arterial grafts (N/A, 12/05/2014);  percutaneous insertion intra-aortic balloon cath (Left, 12/05/2014); Ischemic cardiomyopathy  01-12-22 note: status post a coronary artery bypass grafting at [facility] on December 05, 2014. He had a left internal mammary to the second diagonal, right internal mammary to the PDA and a saphenous vein graft to the OM1. He is doing fairly well currently and is able to play golf frequently no chest pain. He is able to do yard work without difficulty. He did have silent ischemia prior to his coronary artery bypass grafting due to his diabetes. 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%   Neuro/Psych negative neurological ROS  negative psych ROS   GI/Hepatic negative GI ROS, Neg liver ROS,,,  Endo/Other  negative endocrine ROSdiabetes, Type 2, Insulin Dependent    Renal/GU Renal diseasenegative Renal ROS  negative genitourinary   Musculoskeletal negative musculoskeletal ROS (+)    Abdominal   Peds negative pediatric  ROS (+)  Hematology negative hematology ROS (+)   Anesthesia Other Findings Deviated septum Diabetic neuropathy (HCC)  Diabetic retinopathy (HCC) Erectile dysfunction  Hyperlipidemia Hypertension  Hypogonadism in male Microalbuminuria due to type 2 diabetes mellitus (HCC)  Obesity Vitamin B 12 deficiency  Coronary artery disease Diabetic neuropathy associated with type 2 diabetes mellitus (HCC)   History of motion sickness Microalbuminuria      Reproductive/Obstetrics negative OB ROS                             Anesthesia Physical Anesthesia Plan  ASA: 3  Anesthesia Plan: MAC   Post-op Pain Management:    Induction: Intravenous  PONV Risk Score and Plan:   Airway Management Planned: Natural Airway and Nasal Cannula  Additional Equipment:   Intra-op Plan:   Post-operative Plan:   Informed Consent: I have reviewed the patients History and Physical, chart, labs and discussed the procedure including the risks, benefits and alternatives for the proposed anesthesia with the patient or authorized representative who has indicated his/her understanding and acceptance.     Dental Advisory Given  Plan Discussed with: Anesthesiologist, CRNA and Surgeon  Anesthesia Plan Comments: (Patient consented for risks of anesthesia including but not limited to:  - adverse reactions to medications - damage to eyes, teeth, lips or other oral mucosa - nerve damage due to positioning  - sore throat or hoarseness - Damage to heart, brain, nerves, lungs, other parts of body or loss of life  Patient voiced understanding.)       Anesthesia Quick Evaluation

## 2022-09-28 ENCOUNTER — Encounter
Admission: RE | Disposition: A | Discharge status: Home / Self Care | Payer: Self-pay | Source: Home / Self Care | Attending: Ophthalmology

## 2022-09-28 ENCOUNTER — Ambulatory Visit
Admission: RE | Admit: 2022-09-28 | Discharge: 2022-09-28 | Disposition: A | Discharge status: Home / Self Care | Payer: BC Managed Care – PPO | Attending: Ophthalmology | Admitting: Ophthalmology

## 2022-09-28 ENCOUNTER — Ambulatory Visit: Payer: BC Managed Care – PPO | Admitting: Anesthesiology

## 2022-09-28 ENCOUNTER — Other Ambulatory Visit: Payer: Self-pay

## 2022-09-28 DIAGNOSIS — Z794 Long term (current) use of insulin: Secondary | ICD-10-CM | POA: Insufficient documentation

## 2022-09-28 DIAGNOSIS — Z951 Presence of aortocoronary bypass graft: Secondary | ICD-10-CM | POA: Insufficient documentation

## 2022-09-28 DIAGNOSIS — I2510 Atherosclerotic heart disease of native coronary artery without angina pectoris: Secondary | ICD-10-CM | POA: Diagnosis not present

## 2022-09-28 DIAGNOSIS — I10 Essential (primary) hypertension: Secondary | ICD-10-CM | POA: Diagnosis not present

## 2022-09-28 DIAGNOSIS — N289 Disorder of kidney and ureter, unspecified: Secondary | ICD-10-CM | POA: Diagnosis not present

## 2022-09-28 DIAGNOSIS — H5703 Miosis: Secondary | ICD-10-CM | POA: Diagnosis not present

## 2022-09-28 DIAGNOSIS — H2511 Age-related nuclear cataract, right eye: Secondary | ICD-10-CM | POA: Diagnosis not present

## 2022-09-28 DIAGNOSIS — Z79899 Other long term (current) drug therapy: Secondary | ICD-10-CM | POA: Diagnosis not present

## 2022-09-28 DIAGNOSIS — E1136 Type 2 diabetes mellitus with diabetic cataract: Secondary | ICD-10-CM | POA: Diagnosis not present

## 2022-09-28 DIAGNOSIS — I255 Ischemic cardiomyopathy: Secondary | ICD-10-CM | POA: Diagnosis not present

## 2022-09-28 DIAGNOSIS — Z7984 Long term (current) use of oral hypoglycemic drugs: Secondary | ICD-10-CM | POA: Insufficient documentation

## 2022-09-28 HISTORY — PX: CATARACT EXTRACTION W/PHACO: SHX586

## 2022-09-28 LAB — GLUCOSE, CAPILLARY: Glucose-Capillary: 195 mg/dL — ABNORMAL HIGH (ref 70–99)

## 2022-09-28 SURGERY — PHACOEMULSIFICATION, CATARACT, WITH IOL INSERTION
Anesthesia: Monitor Anesthesia Care | Site: Eye | Laterality: Right

## 2022-09-28 MED ORDER — TETRACAINE HCL 0.5 % OP SOLN
1.0000 [drp] | OPHTHALMIC | Status: DC | PRN
  Administered 2022-09-28 (×3): 1 [drp] via OPHTHALMIC

## 2022-09-28 MED ORDER — SIGHTPATH DOSE#1 BSS IO SOLN
INTRAOCULAR | Status: DC | PRN
  Administered 2022-09-28: 1 mL

## 2022-09-28 MED ORDER — SIGHTPATH DOSE#1 BSS IO SOLN
INTRAOCULAR | Status: DC | PRN
  Administered 2022-09-28: 40 mL via OPHTHALMIC

## 2022-09-28 MED ORDER — ARMC OPHTHALMIC DILATING DROPS
1.0000 | OPHTHALMIC | Status: DC | PRN
  Administered 2022-09-28 (×3): 1 via OPHTHALMIC

## 2022-09-28 MED ORDER — BRIMONIDINE TARTRATE-TIMOLOL 0.2-0.5 % OP SOLN
OPHTHALMIC | Status: DC | PRN
  Administered 2022-09-28: 1 [drp] via OPHTHALMIC

## 2022-09-28 MED ORDER — FENTANYL CITRATE (PF) 100 MCG/2ML IJ SOLN
INTRAMUSCULAR | Status: DC | PRN
  Administered 2022-09-28: 50 ug via INTRAVENOUS

## 2022-09-28 MED ORDER — SIGHTPATH DOSE#1 NA HYALUR & NA CHOND-NA HYALUR IO KIT
PACK | INTRAOCULAR | Status: DC | PRN
  Administered 2022-09-28: 1 via OPHTHALMIC

## 2022-09-28 MED ORDER — MIDAZOLAM HCL 2 MG/2ML IJ SOLN
INTRAMUSCULAR | Status: DC | PRN
  Administered 2022-09-28: 2 mg via INTRAVENOUS

## 2022-09-28 MED ORDER — LACTATED RINGERS IV SOLN: INTRAVENOUS | Status: DC

## 2022-09-28 MED ORDER — CEFUROXIME OPHTHALMIC INJECTION 1 MG/0.1 ML
INJECTION | OPHTHALMIC | Status: DC | PRN
  Administered 2022-09-28: .1 mL via INTRACAMERAL

## 2022-09-28 MED ORDER — SIGHTPATH DOSE#1 BSS IO SOLN
INTRAOCULAR | Status: DC | PRN
  Administered 2022-09-28: 15 mL

## 2022-09-28 SURGICAL SUPPLY — 18 items
CANNULA ANT/CHMB 27G (MISCELLANEOUS) IMPLANT
CANNULA ANT/CHMB 27GA (MISCELLANEOUS)
CATARACT SUITE SIGHTPATH (MISCELLANEOUS) ×1
FEE CATARACT SUITE SIGHTPATH (MISCELLANEOUS) ×1 IMPLANT
GLOVE SRG 8 PF TXTR STRL LF DI (GLOVE) ×1 IMPLANT
GLOVE SURG ENC TEXT LTX SZ7.5 (GLOVE) ×1 IMPLANT
GLOVE SURG GAMMEX PI TX LF 7.5 (GLOVE) IMPLANT
GLOVE SURG UNDER POLY LF SZ8 (GLOVE) ×1
LENS IOL TECNIS EYHANCE 20.0 (Intraocular Lens) IMPLANT
NDL FILTER BLUNT 18X1 1/2 (NEEDLE) ×1 IMPLANT
NDL RETROBULBAR .5 NSTRL (NEEDLE) IMPLANT
NEEDLE FILTER BLUNT 18X1 1/2 (NEEDLE) ×1
PACK VIT ANT 23G (MISCELLANEOUS) IMPLANT
RING MALYGIN 7.0 (MISCELLANEOUS) IMPLANT
SUT ETHILON 10-0 CS-B-6CS-B-6 (SUTURE)
SUT VICRYL 9 0 (SUTURE) IMPLANT
SUTURE EHLN 10-0 CS-B-6CS-B-6 (SUTURE) IMPLANT
SYR 3ML LL SCALE MARK (SYRINGE) ×1 IMPLANT

## 2022-09-28 NOTE — Transfer of Care (Signed)
Immediate Anesthesia Transfer of Care Note  Patient: Ronnie Morales  Procedure(s) Performed: CATARACT EXTRACTION PHACO AND INTRAOCULAR LENS PLACEMENT (IOC) RIGHT MALYUGIN (Right: Eye)  Patient Location: PACU  Anesthesia Type: MAC  Level of Consciousness: awake, alert  and patient cooperative  Airway and Oxygen Therapy: Patient Spontanous Breathing and Patient connected to supplemental oxygen  Post-op Assessment: Post-op Vital signs reviewed, Patient's Cardiovascular Status Stable, Respiratory Function Stable, Patent Airway and No signs of Nausea or vomiting  Post-op Vital Signs: Reviewed and stable  Complications: No notable events documented.

## 2022-09-28 NOTE — H&P (Signed)
[facility]   Primary Care Physician:  Myrene Buddy, NP Ophthalmologist: Dr. Lockie Mola  Pre-Procedure History & Physical: HPI:  Ronnie Morales is a 62 y.o. male here for ophthalmic surgery.   Past Medical History:  Diagnosis Date   Coronary artery disease    Deviated septum    Diabetes mellitus without complication (HCC)    Diabetic neuropathy (HCC)    Diabetic neuropathy associated with type 2 diabetes mellitus (HCC)    Diabetic retinopathy (HCC)    Erectile dysfunction    History of motion sickness    Hyperlipidemia    Hypertension    Hypogonadism in male    Microalbuminuria    Microalbuminuria due to type 2 diabetes mellitus (HCC)    Obesity    Vitamin B 12 deficiency     Past Surgical History:  Procedure Laterality Date   CARDIAC CATHETERIZATION N/A 11/21/2014   Procedure: Left Heart Cath and Coronary Angiography;  Surgeon: Dalia Heading, MD;  Location: [facility] INVASIVE CV LAB;  Service: Cardiovascular;  Laterality: N/A;   CHOLECYSTECTOMY     COLONOSCOPY WITH PROPOFOL N/A 03/12/2018   Procedure: COLONOSCOPY WITH PROPOFOL;  Surgeon: Scot Jun, MD;  Location: [facility] ENDOSCOPY;  Service: Endoscopy;  Laterality: N/A;   CORONARY ANGIOPLASTY     CORONARY ARTERY BYPASS GRAFT  03/06/2014   x 3   EYE SURGERY     TONSILLECTOMY      Prior to Admission medications   Medication Sig Start Date End Date Taking? Authorizing Provider  aspirin 81 MG chewable tablet Chew by mouth.   Yes [provider]  aspirin 81 MG tablet Take 81 mg by mouth daily.   Yes [provider]  atorvastatin (LIPITOR) 40 MG tablet Take by mouth. 12/15/14 09/28/22 Yes [provider]  carvedilol (COREG) 6.25 MG tablet Take 6.25 mg by mouth 2 (two) times daily with a meal.   Yes [provider]  furosemide (LASIX) 40 MG tablet Take by mouth.   Yes [provider]  insulin aspart protamine- aspart (NOVOLOG MIX 70/30) (70-30)  100 UNIT/ML injection Inject into the skin. 14-18 units sub q 3 times daily with meals   Yes [provider]  Insulin Degludec (TRESIBA [place]) Inject 50 Units into the skin every evening.   Yes [provider]  lisinopril (PRINIVIL,ZESTRIL) 10 MG tablet Take 10 mg by mouth daily.   Yes [provider]  metFORMIN (GLUCOPHAGE) 500 MG tablet Take 500 mg by mouth 2 (two) times daily with a meal.   Yes [provider]  OZEMPIC, 0.25 OR 0.5 MG/DOSE, 2 MG/3ML SOPN Apply topically. 08/03/21  Yes [provider]  simvastatin (ZOCOR) 40 MG tablet Take 40 mg by mouth daily.   Yes [provider]  vitamin B-12 (CYANOCOBALAMIN) 1000 MCG tablet Take 1,000 mcg by mouth daily.   Yes [provider]  albuterol (VENTOLIN HFA) 108 (90 Base) MCG/ACT inhaler Inhale 1-2 puffs into the lungs every 4 (four) hours as needed for wheezing or shortness of breath. Patient not taking: Reported on 09/20/2022 12/17/21   Domenick Gong, MD  azithromycin (ZITHROMAX) 250 MG tablet Take 1 tablet (250 mg total) by mouth daily. 2 tabs po on day 1, 1 tab po on days 2-5 Patient not taking: Reported on 09/20/2022 12/17/21   Domenick Gong, MD  insulin detemir (LEVEMIR) 100 UNIT/ML injection Inject into the skin at bedtime. 60 units sub q nightly Patient not taking: Reported on 09/20/2022  [provider]  ipratropium (ATROVENT) 0.06 % nasal spray Place 2 sprays into both nostrils 4 (four) times daily. Patient not taking: Reported on 09/20/2022 12/17/21   Domenick Gong, MD  promethazine-dextromethorphan (PROMETHAZINE-DM) 6.25-15 MG/5ML syrup Take 5 mLs by mouth 4 (four) times daily as needed for cough. Patient not taking: Reported on 09/20/2022 01/09/22   Tommie Sams, DO  Spacer/Aero-Holding Chambers (AEROCHAMBER MV) inhaler Use as instructed Patient not taking: Reported on 09/20/2022 12/17/21   Domenick Gong, MD  fluticasone [facility]) 50 MCG/ACT nasal spray Place 1  spray into both nostrils daily. As needed for rhinitis  05/09/19  [provider]  potassium chloride SA (K-DUR,KLOR-CON) 20 MEQ tablet Take by mouth.  05/09/19  [provider]    Allergies as of 09/05/2022   (No Known Allergies)    History reviewed. No pertinent family history.  Social History   Socioeconomic History   Marital status: Married    Spouse name: Not on file   Number of children: Not on file   Years of education: Not on file   Highest education level: Not on file  Occupational History   Not on file  Tobacco Use   Smoking status: Never   Smokeless tobacco: Current    Types: Chew  Vaping Use   Vaping status: Never Used  Substance and Sexual Activity   Alcohol use: Yes    Alcohol/week: 2.0 standard drinks of alcohol    Types: 2 Cans of beer per week    Comment: rarely   Drug use: No   Sexual activity: Not on file  Other Topics Concern   Not on file  Social History Narrative   Not on file   Social Determinants of Health   Financial Resource Strain: Not on file  Food Insecurity: Not on file  Transportation Needs: Not on file  Physical Activity: Not on file  Stress: Not on file  Social Connections: Not on file  Intimate Partner Violence: Not on file    Review of Systems: See HPI, otherwise negative ROS  Physical Exam: BP 131/77   Pulse 66   Temp (!) 97.4 F (36.3 C) (Temporal)   Resp 20   Ht 5\' 8"  (1.727 m)   Wt 109.8 kg   SpO2 97%   BMI 36.80 kg/m  General:   Alert,  pleasant and cooperative in NAD Head:  Normocephalic and atraumatic. Lungs:  Clear to auscultation.    Heart:  Regular rate and rhythm.   Impression/Plan: Ronnie Morales is here for ophthalmic surgery.  Risks, benefits, limitations, and alternatives regarding ophthalmic surgery have been reviewed with the patient.  Questions have been answered.  All parties agreeable.   Lockie Mola, MD  09/28/2022, 7:37 AM

## 2022-09-28 NOTE — Op Note (Signed)
OPERATIVE NOTE  Ronnie Morales [id number] 09/28/2022   PREOPERATIVE DIAGNOSIS:    Nuclear Sclerotic Cataract Right eye with miotic pupil.        H25.11  POSTOPERATIVE DIAGNOSIS: Nuclear Sclerotic Cataract Right eye with miotic pupil.          PROCEDURE:  Phacoemusification with posterior chamber intraocular lens placement of the right eye which required pupil stretching with the Malyugin pupil expansion device. Ultrasound time: Procedure(s) with comments: CATARACT EXTRACTION PHACO AND INTRAOCULAR LENS PLACEMENT (IOC) RIGHT MALYUGIN (Right) - 4.55 0:28.4  LENS:   Implant Name Type Inv. Item Serial No. Manufacturer Lot No. LRB No. Used Action  LENS IOL TECNIS EYHANCE 20.0 - [id number] Intraocular Lens LENS IOL TECNIS EYHANCE 20.0 [id number] [employer]  Right 1 Implanted      SURGEON:  Deirdre Evener, MD   ANESTHESIA:  Topical with tetracaine drops and 2% Xylocaine jelly, augmented with 1% preservative-free intracameral lidocaine.   COMPLICATIONS:  None.   DESCRIPTION OF PROCEDURE:  The patient was identified in the holding room and transported to the operating room and placed in the supine position under the operating microscope. The right eye was identified as the operative eye and it was prepped and draped in the usual sterile ophthalmic fashion.   A 1 millimeter clear-corneal paracentesis was made at the 12:00 position.  0.5 ml of preservative-free 1% lidocaine was injected into the anterior chamber. The anterior chamber was filled with Viscoat viscoelastic.  A 2.4 millimeter keratome was used to make a near-clear corneal incision at the 9:00 position. A Malyugin pupil expander was then placed through the main incision and into the anterior chamber of the eye.  The edge of the iris was secured on the lip of the pupil expander and it was released, thereby expanding the pupil to approximately 7 millimeters for completion of the cataract surgery.  Additional Viscoat was  placed in the anterior chamber.  A cystotome and capsulorrhexis forceps were used to make a curvilinear capsulorrhexis.   Balanced salt solution was used to hydrodissect and hydrodelineate the lens nucleus.   Phacoemulsification was used in stop and chop fashion to remove the lens, nucleus and epinucleus.  The remaining cortex was aspirated using the irrigation aspiration handpiece.  Additional Provisc was placed into the eye to distend the capsular bag for lens placement.  A lens was then injected into the capsular bag.  The pupil expanding ring was removed using a Kuglen hook and insertion device. The remaining viscoelastic was aspirated from the capsular bag and the anterior chamber.  The anterior chamber was filled with balanced salt solution to inflate to a physiologic pressure.  Wounds were hydrated with balanced salt solution.  The anterior chamber was inflated to a physiologic pressure with balanced salt solution.  No wound leaks were noted.Cefuroxime 0.1 ml of a 10mg /ml solution was injected into the anterior chamber for a dose of 1 mg of intracameral antibiotic at the completion of the case. Timolol and Brimonidine drops were applied to the eye.  The patient was taken to the recovery room in stable condition without complications of anesthesia or surgery.  , 09/28/2022, 7:58 AM

## 2022-09-28 NOTE — Anesthesia Postprocedure Evaluation (Signed)
Anesthesia Post Note  Patient: Ronnie Morales  Procedure(s) Performed: CATARACT EXTRACTION PHACO AND INTRAOCULAR LENS PLACEMENT (IOC) RIGHT MALYUGIN (Right: Eye)  Patient location during evaluation: PACU Anesthesia Type: MAC Level of consciousness: awake and alert Pain management: pain level controlled Vital Signs Assessment: post-procedure vital signs reviewed and stable Respiratory status: spontaneous breathing, nonlabored ventilation, respiratory function stable and patient connected to nasal cannula oxygen Cardiovascular status: stable and blood pressure returned to baseline Postop Assessment: no apparent nausea or vomiting Anesthetic complications: no   No notable events documented.   Last Vitals:  Vitals:   09/28/22 0757 09/28/22 0802  BP: 125/64 120/66  Pulse: 62 63  Resp: 17 18  Temp: (!) 36.3 C (!) 36.3 C  SpO2: 98% 97%    Last Pain:  Vitals:   09/28/22 0802  TempSrc:   PainSc: 0-No pain                  C 

## 2022-09-29 ENCOUNTER — Encounter: Payer: Self-pay | Admitting: Ophthalmology

## 2022-10-10 NOTE — Anesthesia Preprocedure Evaluation (Signed)
Anesthesia Evaluation  Patient identified by MRN, date of birth, ID band Patient awake    Reviewed: Allergy & Precautions, H&P , NPO status , Patient's Chart, lab work & pertinent test results  Airway Mallampati: IV  TM Distance: <3 FB Neck ROM: Full   Comment: Mouth opening: Limited Mouth Opening Comment: Thick neck Based on Mallampati score, thick neck, redundant tissue, would be concerned with probable difficult intubation if this were necessary.  Dental no notable dental hx.    Pulmonary neg pulmonary ROS   Pulmonary exam normal breath sounds clear to auscultation       Cardiovascular hypertension, + CAD and + CABG  negative cardio ROS Normal cardiovascular exam Rhythm:Regular Rate:Normal  CAD, Cardiac catheterization; coronary artery bypass w/venous & arterial grafts (N/A, 12/05/2014);  percutaneous insertion intra-aortic balloon cath (Left, 12/05/2014); Ischemic cardiomyopathy  01-12-22 note: status post a coronary artery bypass grafting at [facility] on December 05, 2014. He had a left internal mammary to the second diagonal, right internal mammary to the PDA and a saphenous vein graft to the OM1. He is doing fairly well currently and is able to play golf frequently no chest pain. He is able to do yard work without difficulty. He did have silent ischemia prior to his coronary artery bypass grafting due to his diabetes. 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%    Neuro/Psych negative neurological ROS  negative psych ROS   GI/Hepatic negative GI ROS, Neg liver ROS,,,  Endo/Other  negative endocrine ROSdiabetes    Renal/GU Renal diseasenegative Renal ROS  negative genitourinary   Musculoskeletal negative musculoskeletal ROS (+)    Abdominal   Peds negative pediatric ROS (+)  Hematology negative hematology ROS (+)   Anesthesia Other  Findings Deviated septum Diabetic neuropathy (HCC) Diabetic retinopathy (HCC) Erectile dysfunction  Hyperlipidemia Hypertension  Microalbuminuria due to type 2 diabetes mellitus (HCC) Obesity Vitamin B 12 deficiency  Coronary artery disease  History of motion sickness   Previous cataract surgery 09-28-22 w/Dr. Juel Burrow   Reproductive/Obstetrics negative OB ROS                             Anesthesia Physical Anesthesia Plan  ASA: 3  Anesthesia Plan: MAC   Post-op Pain Management:    Induction: Intravenous  PONV Risk Score and Plan:   Airway Management Planned: Natural Airway and Nasal Cannula  Additional Equipment:   Intra-op Plan:   Post-operative Plan:   Informed Consent: I have reviewed the patients History and Physical, chart, labs and discussed the procedure including the risks, benefits and alternatives for the proposed anesthesia with the patient or authorized representative who has indicated his/her understanding and acceptance.     Dental Advisory Given  Plan Discussed with: Anesthesiologist, CRNA and Surgeon  Anesthesia Plan Comments: (Patient consented for risks of anesthesia including but not limited to:  - adverse reactions to medications - damage to eyes, teeth, lips or other oral mucosa - nerve damage due to positioning  - sore throat or hoarseness - Damage to heart, brain, nerves, lungs, other parts of body or loss of life  Patient voiced understanding.)       Anesthesia Quick Evaluation

## 2022-10-10 NOTE — Discharge Instructions (Signed)

## 2022-10-12 ENCOUNTER — Ambulatory Visit: Payer: BC Managed Care – PPO | Admitting: Anesthesiology

## 2022-10-12 ENCOUNTER — Encounter: Payer: Self-pay | Admitting: Ophthalmology

## 2022-10-12 ENCOUNTER — Encounter
Admission: RE | Disposition: A | Discharge status: Home / Self Care | Payer: Self-pay | Source: Home / Self Care | Attending: Ophthalmology

## 2022-10-12 ENCOUNTER — Other Ambulatory Visit: Payer: Self-pay

## 2022-10-12 ENCOUNTER — Ambulatory Visit
Admission: RE | Admit: 2022-10-12 | Discharge: 2022-10-12 | Disposition: A | Discharge status: Home / Self Care | Payer: BC Managed Care – PPO | Attending: Ophthalmology | Admitting: Ophthalmology

## 2022-10-12 DIAGNOSIS — E1136 Type 2 diabetes mellitus with diabetic cataract: Secondary | ICD-10-CM | POA: Diagnosis present

## 2022-10-12 DIAGNOSIS — E1140 Type 2 diabetes mellitus with diabetic neuropathy, unspecified: Secondary | ICD-10-CM | POA: Diagnosis not present

## 2022-10-12 DIAGNOSIS — Z794 Long term (current) use of insulin: Secondary | ICD-10-CM | POA: Diagnosis not present

## 2022-10-12 DIAGNOSIS — Z7984 Long term (current) use of oral hypoglycemic drugs: Secondary | ICD-10-CM | POA: Diagnosis not present

## 2022-10-12 DIAGNOSIS — H2512 Age-related nuclear cataract, left eye: Secondary | ICD-10-CM | POA: Insufficient documentation

## 2022-10-12 DIAGNOSIS — Z951 Presence of aortocoronary bypass graft: Secondary | ICD-10-CM | POA: Diagnosis not present

## 2022-10-12 DIAGNOSIS — E785 Hyperlipidemia, unspecified: Secondary | ICD-10-CM | POA: Insufficient documentation

## 2022-10-12 DIAGNOSIS — I10 Essential (primary) hypertension: Secondary | ICD-10-CM | POA: Diagnosis not present

## 2022-10-12 DIAGNOSIS — I2510 Atherosclerotic heart disease of native coronary artery without angina pectoris: Secondary | ICD-10-CM | POA: Diagnosis not present

## 2022-10-12 HISTORY — PX: CATARACT EXTRACTION W/PHACO: SHX586

## 2022-10-12 LAB — GLUCOSE, CAPILLARY: Glucose-Capillary: 137 mg/dL — ABNORMAL HIGH (ref 70–99)

## 2022-10-12 SURGERY — PHACOEMULSIFICATION, CATARACT, WITH IOL INSERTION
Anesthesia: Monitor Anesthesia Care | Site: Eye | Laterality: Left

## 2022-10-12 MED ORDER — BRIMONIDINE TARTRATE-TIMOLOL 0.2-0.5 % OP SOLN
OPHTHALMIC | Status: DC | PRN
  Administered 2022-10-12: 1 [drp] via OPHTHALMIC

## 2022-10-12 MED ORDER — ARMC OPHTHALMIC DILATING DROPS
1.0000 | OPHTHALMIC | Status: DC | PRN
  Administered 2022-10-12 (×3): 1 via OPHTHALMIC

## 2022-10-12 MED ORDER — CEFUROXIME OPHTHALMIC INJECTION 1 MG/0.1 ML
INJECTION | OPHTHALMIC | Status: DC | PRN
  Administered 2022-10-12: .1 mL via INTRACAMERAL

## 2022-10-12 MED ORDER — FENTANYL CITRATE (PF) 100 MCG/2ML IJ SOLN
INTRAMUSCULAR | Status: DC | PRN
  Administered 2022-10-12: 100 ug via INTRAVENOUS

## 2022-10-12 MED ORDER — LACTATED RINGERS IV SOLN: INTRAVENOUS | Status: DC

## 2022-10-12 MED ORDER — MIDAZOLAM HCL 2 MG/2ML IJ SOLN
INTRAMUSCULAR | Status: DC | PRN
  Administered 2022-10-12: 2 mg via INTRAVENOUS

## 2022-10-12 MED ORDER — SIGHTPATH DOSE#1 NA HYALUR & NA CHOND-NA HYALUR IO KIT
PACK | INTRAOCULAR | Status: DC | PRN
  Administered 2022-10-12: 1 via OPHTHALMIC

## 2022-10-12 MED ORDER — TETRACAINE HCL 0.5 % OP SOLN
1.0000 [drp] | OPHTHALMIC | Status: DC | PRN
  Administered 2022-10-12 (×3): 1 [drp] via OPHTHALMIC

## 2022-10-12 MED ORDER — SIGHTPATH DOSE#1 BSS IO SOLN
INTRAOCULAR | Status: DC | PRN
  Administered 2022-10-12: 15 mL via INTRAOCULAR

## 2022-10-12 MED ORDER — SIGHTPATH DOSE#1 BSS IO SOLN
INTRAOCULAR | Status: DC | PRN
  Administered 2022-10-12: 1 mL

## 2022-10-12 MED ORDER — SIGHTPATH DOSE#1 BSS IO SOLN
INTRAOCULAR | Status: DC | PRN
  Administered 2022-10-12: 61 mL via OPHTHALMIC

## 2022-10-12 SURGICAL SUPPLY — 11 items
CATARACT SUITE SIGHTPATH (MISCELLANEOUS) ×1
FEE CATARACT SUITE SIGHTPATH (MISCELLANEOUS) ×1 IMPLANT
GLOVE SRG 8 PF TXTR STRL LF DI (GLOVE) ×1 IMPLANT
GLOVE SURG ENC TEXT LTX SZ7.5 (GLOVE) ×1 IMPLANT
GLOVE SURG GAMMEX PI TX LF 7.5 (GLOVE) IMPLANT
GLOVE SURG UNDER POLY LF SZ8 (GLOVE) ×1
LENS IOL TECNIS EYHANCE 20.5 (Intraocular Lens) IMPLANT
NDL FILTER BLUNT 18X1 1/2 (NEEDLE) ×1 IMPLANT
NEEDLE FILTER BLUNT 18X1 1/2 (NEEDLE) ×1
RING MALYGIN 7.0 (MISCELLANEOUS) IMPLANT
SYR 3ML LL SCALE MARK (SYRINGE) ×1 IMPLANT

## 2022-10-12 NOTE — Transfer of Care (Signed)
Immediate Anesthesia Transfer of Care Note  Patient: Ronnie Morales  Procedure(s) Performed: CATARACT EXTRACTION PHACO AND INTRAOCULAR LENS PLACEMENT (IOC) LEFT DIABETIC MALYUGIN 4.93 00:41.4 (Left: Eye)  Patient Location: PACU  Anesthesia Type: MAC  Level of Consciousness: awake, alert  and patient cooperative  Airway and Oxygen Therapy: Patient Spontanous Breathing and Patient connected to supplemental oxygen  Post-op Assessment: Post-op Vital signs reviewed, Patient's Cardiovascular Status Stable, Respiratory Function Stable, Patent Airway and No signs of Nausea or vomiting  Post-op Vital Signs: Reviewed and stable  Complications: No notable events documented.

## 2022-10-12 NOTE — Anesthesia Postprocedure Evaluation (Signed)
Anesthesia Post Note  Patient: Ronnie Morales  Procedure(s) Performed: CATARACT EXTRACTION PHACO AND INTRAOCULAR LENS PLACEMENT (IOC) LEFT DIABETIC MALYUGIN 4.93 00:41.4 (Left: Eye)  Patient location during evaluation: PACU Anesthesia Type: MAC Level of consciousness: awake and alert Pain management: pain level controlled Vital Signs Assessment: post-procedure vital signs reviewed and stable Respiratory status: spontaneous breathing, nonlabored ventilation, respiratory function stable and patient connected to nasal cannula oxygen Cardiovascular status: stable and blood pressure returned to baseline Postop Assessment: no apparent nausea or vomiting Anesthetic complications: no   No notable events documented.   Last Vitals:  Vitals:   10/12/22 0759 10/12/22 0804  BP: (!) 109/59 109/62  Pulse: 61 62  Resp: (!) 9 14  Temp: (!) 36.3 C (!) 36.3 C  SpO2: 98% 98%    Last Pain:  Vitals:   10/12/22 0804  TempSrc:   PainSc: 0-No pain                 Shaunna Rosetti C Clemencia Helzer

## 2022-10-12 NOTE — H&P (Signed)
Ronnie Morales   Primary Care Physician:  Myrene Buddy, NP Ophthalmologist: Dr. Lockie Mola  Pre-Procedure History & Physical: HPI:  Ronnie Morales is a 62 y.o. male here for ophthalmic surgery.   Past Medical History:  Diagnosis Date   Coronary artery disease    Deviated septum    Diabetes mellitus without complication (HCC)    Diabetic neuropathy (HCC)    Diabetic neuropathy associated with type 2 diabetes mellitus (HCC)    Diabetic retinopathy (HCC)    Erectile dysfunction    History of motion sickness    Hyperlipidemia    Hypertension    Hypogonadism in male    Microalbuminuria    Microalbuminuria due to type 2 diabetes mellitus (HCC)    Obesity    Vitamin B 12 deficiency     Past Surgical History:  Procedure Laterality Date   CARDIAC CATHETERIZATION N/A 11/21/2014   Procedure: Left Heart Cath and Coronary Angiography;  Surgeon: Dalia Heading, MD;  Location: [facility] INVASIVE CV LAB;  Service: Cardiovascular;  Laterality: N/A;   CATARACT EXTRACTION W/PHACO Right 09/28/2022   Procedure: CATARACT EXTRACTION PHACO AND INTRAOCULAR LENS PLACEMENT (IOC) RIGHT MALYUGIN;  Surgeon: Lockie Mola, MD;  Location: [facility];  Service: Ophthalmology;  Laterality: Right;  4.55 0:28.4   CHOLECYSTECTOMY     COLONOSCOPY WITH PROPOFOL N/A 03/12/2018   Procedure: COLONOSCOPY WITH PROPOFOL;  Surgeon: Scot Jun, MD;  Location: [facility] ENDOSCOPY;  Service: Endoscopy;  Laterality: N/A;   CORONARY ANGIOPLASTY     CORONARY ARTERY BYPASS GRAFT  03/06/2014   x 3   EYE SURGERY     TONSILLECTOMY      Prior to Admission medications   Medication Sig Start Date End Date Taking? Authorizing Provider  aspirin 81 MG chewable tablet Chew by mouth.   Yes [provider]  aspirin 81 MG tablet Take 81 mg by mouth daily.   Yes [provider]  atorvastatin (LIPITOR) 40 MG tablet Take by mouth. 12/15/14 10/12/22 Yes [provider]   azithromycin (ZITHROMAX) 250 MG tablet Take 1 tablet (250 mg total) by mouth daily. 2 tabs po on day 1, 1 tab po on days 2-5 12/17/21  Yes Domenick Gong, MD  carvedilol (COREG) 6.25 MG tablet Take 6.25 mg by mouth 2 (two) times daily with a meal.   Yes [provider]  furosemide (LASIX) 40 MG tablet Take by mouth.   Yes [provider]  insulin aspart protamine- aspart (NOVOLOG MIX 70/30) (70-30) 100 UNIT/ML injection Inject into the skin. 14-18 units sub q 3 times daily with meals   Yes [provider]  Insulin Degludec (TRESIBA [place]) Inject 50 Units into the skin every evening.   Yes [provider]  insulin detemir (LEVEMIR) 100 UNIT/ML injection Inject into the skin at bedtime. 60 units sub q nightly   Yes [provider]  ipratropium (ATROVENT) 0.06 % nasal spray Place 2 sprays into both nostrils 4 (four) times daily. 12/17/21  Yes Domenick Gong, MD  lisinopril (PRINIVIL,ZESTRIL) 10 MG tablet Take 10 mg by mouth daily.   Yes [provider]  metFORMIN (GLUCOPHAGE) 500 MG tablet Take 500 mg by mouth 2 (two) times daily with a meal.   Yes [provider]  OZEMPIC, 0.25 OR 0.5 MG/DOSE, 2 MG/3ML SOPN Apply topically. 08/03/21  Yes [provider]  promethazine-dextromethorphan (PROMETHAZINE-DM) 6.25-15 MG/5ML syrup Take 5 mLs by mouth 4 (four) times daily as needed for cough. 01/09/22  Yes  Cook, Jayce G, DO  simvastatin (ZOCOR) 40 MG tablet Take 40 mg by mouth daily.   Yes [provider]  vitamin B-12 (CYANOCOBALAMIN) 1000 MCG tablet Take 1,000 mcg by mouth daily.   Yes [provider]  albuterol (VENTOLIN HFA) 108 (90 Base) MCG/ACT inhaler Inhale 1-2 puffs into the lungs every 4 (four) hours as needed for wheezing or shortness of breath. Patient not taking: Reported on 09/20/2022 12/17/21   Domenick Gong, MD  Spacer/Aero-Holding Chambers (AEROCHAMBER MV) inhaler Use as instructed Patient not taking:  Reported on 10/12/2022 12/17/21   Domenick Gong, MD  fluticasone [facility]) 50 MCG/ACT nasal spray Place 1 spray into both nostrils daily. As needed for rhinitis  05/09/19  [provider]  potassium chloride SA (K-DUR,KLOR-CON) 20 MEQ tablet Take by mouth.  05/09/19  [provider]    Allergies as of 09/05/2022   (No Known Allergies)    History reviewed. No pertinent family history.  Social History   Socioeconomic History   Marital status: Married    Spouse name: Not on file   Number of children: Not on file   Years of education: Not on file   Highest education level: Not on file  Occupational History   Not on file  Tobacco Use   Smoking status: Never   Smokeless tobacco: Current    Types: Chew  Vaping Use   Vaping status: Never Used  Substance and Sexual Activity   Alcohol use: Yes    Alcohol/week: 2.0 standard drinks of alcohol    Types: 2 Cans of beer per week    Comment: rarely   Drug use: No   Sexual activity: Not on file  Other Topics Concern   Not on file  Social History Narrative   Not on file   Social Determinants of Health   Financial Resource Strain: Not on file  Food Insecurity: Not on file  Transportation Needs: Not on file  Physical Activity: Not on file  Stress: Not on file  Social Connections: Not on file  Intimate Partner Violence: Not on file    Review of Systems: See HPI, otherwise negative ROS  Physical Exam: BP 127/70   Pulse 61   Temp (!) 97.5 F (36.4 C) (Temporal)   Resp 14   Ht 5\' 8"  (1.727 m)   Wt 108 kg   SpO2 98%   BMI 36.19 kg/m  General:   Alert,  pleasant and cooperative in NAD Head:  Normocephalic and atraumatic. Lungs:  Clear to auscultation.    Heart:  Regular rate and rhythm.   Impression/Plan: Ronnie Morales is here for ophthalmic surgery.  Risks, benefits, limitations, and alternatives regarding ophthalmic surgery have been reviewed with the patient.  Questions have been answered.   All parties agreeable.   Lockie Mola, MD  10/12/2022, 7:31 AM

## 2022-10-12 NOTE — Op Note (Signed)
OPERATIVE NOTE  Ronnie Morales [id number] 10/12/2022  PREOPERATIVE DIAGNOSIS:   Nuclear sclerotic cataract left eye with miotic pupil      H25.12   POSTOPERATIVE DIAGNOSIS:   Nuclear sclerotic cataract left eye with miotic pupil.     PROCEDURE:  Phacoemulsification with posterior chamber intraocular lens implantation of the left eye which required pupil stretching with the Malyugin pupil expansion device  Ultrasound time: Procedure(s): CATARACT EXTRACTION PHACO AND INTRAOCULAR LENS PLACEMENT (IOC) LEFT DIABETIC MALYUGIN 4.93 00:41.4 (Left)  LENS:   Implant Name Type Inv. Item Serial No. Manufacturer Lot No. LRB No. Used Action  LENS IOL TECNIS EYHANCE 20.5 - [id number] Intraocular Lens LENS IOL TECNIS EYHANCE 20.5 [id number] [employer]  Left 1 Implanted          SURGEON:  Deirdre Evener, MD   ANESTHESIA: Topical with tetracaine drops and 2% Xylocaine jelly, augmented with 1% preservative-free intracameral lidocaine.   COMPLICATIONS:  None.   DESCRIPTION OF PROCEDURE:  The patient was identified in the holding room and transported to the operating room and placed in the supine position under the operating microscope.  The left eye was identified as the operative eye and it was prepped and draped in the usual sterile ophthalmic fashion.   A 1 millimeter clear-corneal paracentesis was made at the 1:30 position.  The anterior chamber was filled with Viscoat viscoelastic.  0.5 ml of preservative-free 1% lidocaine was injected into the anterior chamber.  A 2.4 millimeter keratome was used to make a near-clear corneal incision at the 10:30 position.  A Malyugin pupil expander was then placed through the main incision and into the anterior chamber of the eye.  The edge of the iris was secured on the lip of the pupil expander and it was released, thereby expanding the pupil to approximately 7 millimeters for completion of the cataract surgery.  Additional Viscoat was placed in the  anterior chamber.  A cystotome and capsulorrhexis forceps were used to make a curvilinear capsulorrhexis.   Balanced salt solution was used to hydrodissect and hydrodelineate the lens nucleus.   Phacoemulsification was used in stop and chop fashion to remove the lens, nucleus and epinucleus.  The remaining cortex was aspirated using the irrigation aspiration handpiece.  Additional Provisc was placed into the eye to distend the capsular bag for lens placement.  A lens was then injected into the capsular bag.  The pupil expanding ring was removed using a Kuglen hook and insertion device. The remaining viscoelastic was aspirated from the capsular bag and the anterior chamber.  The anterior chamber was filled with balanced salt solution to inflate to a physiologic pressure.   Wounds were hydrated with balanced salt solution.  The anterior chamber was inflated to a physiologic pressure with balanced salt solution.  No wound leaks were noted. Cefuroxime 0.1 ml of a 10mg /ml solution was injected into the anterior chamber for a dose of 1 mg of intracameral antibiotic at the completion of the case.   Timolol and Brimonidine drops were applied to the eye.  The patient was taken to the recovery room in stable condition without complications of anesthesia or surgery.  Deitrich Steve 10/12/2022, 7:58 AM

## 2022-10-13 ENCOUNTER — Encounter: Payer: Self-pay | Admitting: Ophthalmology

## 2023-02-01 ENCOUNTER — Encounter: Payer: Self-pay | Admitting: Gastroenterology

## 2023-02-02 ENCOUNTER — Ambulatory Visit
Admission: RE | Admit: 2023-02-02 | Payer: BC Managed Care – PPO | Source: Home / Self Care | Admitting: Gastroenterology

## 2023-02-02 ENCOUNTER — Encounter: Payer: Self-pay | Admitting: Gastroenterology

## 2023-02-02 ENCOUNTER — Encounter: Payer: Self-pay | Admitting: Anesthesiology

## 2023-02-02 HISTORY — DX: Ischemic cardiomyopathy: I25.5

## 2023-02-02 SURGERY — COLONOSCOPY WITH PROPOFOL
Anesthesia: General

## 2023-02-02 NOTE — H&P (Signed)
Patient did not present for scheduled colonoscopy.  Enis Slipper, DO [facility]

## 2023-02-21 ENCOUNTER — Ambulatory Visit
Admission: RE | Admit: 2023-02-21 | Discharge: 2023-02-21 | Disposition: A | Discharge status: Home / Self Care | Payer: BC Managed Care – PPO | Source: Ambulatory Visit | Attending: Emergency Medicine | Admitting: Emergency Medicine

## 2023-02-21 VITALS — BP 138/70 | HR 83 | Temp 98.3°F | Resp 18

## 2023-02-21 DIAGNOSIS — J069 Acute upper respiratory infection, unspecified: Secondary | ICD-10-CM | POA: Diagnosis present

## 2023-02-21 DIAGNOSIS — Z20822 Contact with and (suspected) exposure to covid-19: Secondary | ICD-10-CM

## 2023-02-21 DIAGNOSIS — J101 Influenza due to other identified influenza virus with other respiratory manifestations: Secondary | ICD-10-CM | POA: Insufficient documentation

## 2023-02-21 LAB — GROUP A STREP BY PCR: Group A Strep by PCR: NOT DETECTED

## 2023-02-21 LAB — RESP PANEL BY RT-PCR (FLU A&B, COVID) ARPGX2
Influenza A by PCR: POSITIVE — AB
Influenza B by PCR: NEGATIVE
SARS Coronavirus 2 by RT PCR: NEGATIVE

## 2023-02-21 MED ORDER — PROMETHAZINE-DM 6.25-15 MG/5ML PO SYRP: 5.0000 mL | ORAL_SOLUTION | Freq: Four times a day (QID) | ORAL | 0 refills | Status: AC | PRN

## 2023-02-21 MED ORDER — OSELTAMIVIR PHOSPHATE 75 MG PO CAPS: 75.0000 mg | ORAL_CAPSULE | Freq: Two times a day (BID) | ORAL | 0 refills | Status: AC

## 2023-02-21 MED ORDER — IPRATROPIUM BROMIDE 0.06 % NA SOLN: 2.0000 | Freq: Four times a day (QID) | NASAL | 0 refills | Status: AC

## 2023-02-21 MED ORDER — NAPROXEN 500 MG PO TABS: 500.0000 mg | ORAL_TABLET | Freq: Two times a day (BID) | ORAL | 0 refills | Status: AC

## 2023-02-21 NOTE — ED Provider Notes (Signed)
 HPI  SUBJECTIVE:  Ronnie Morales is a 63 y.o. male who presents with 3 days of chills, nasal congestion, clear rhinorrhea, sore throat, postnasal drip, cough occasionally productive of white phlegm, body aches, headaches.  He reports posttussive emesis x 1 yesterday.  No fevers, sinus pain or pressure, wheezing, chest pain, shortness of breath, dyspnea on exertion, nausea, diarrhea, abdominal pain.  He is unable to sleep at night because of the cough.  No known strep, COVID or flu exposure.  He got 3 doses of the COVID-vaccine and he got this years flu vaccine.  No antibiotics in the past 3 months.  No antipyretic in the past 6 hours.  He tried Tylenol, cough drops and test send diabetic without improvement in his symptoms.  Symptoms are worse with lying down. Patient has a past medical history of coronary disease status post CABG x 3, diabetes, hyperlipidemia, hypertension, ischemic cardiomyopathy.  No history of pulmonary disease, smoking.  [facility]  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 diabetes mellitus (HCC)    Obesity    Vitamin B 12 deficiency     Past Surgical History:  Procedure Laterality Date   CARDIAC CATHETERIZATION N/A 11/21/2014   Procedure: Left Heart Cath and Coronary Angiography;  Surgeon: Vinie DELENA Jude, MD;  Location: [facility] INVASIVE CV LAB;  Service: Cardiovascular;  Laterality: N/A;   CATARACT EXTRACTION W/PHACO Right 09/28/2022   Procedure: CATARACT EXTRACTION PHACO AND INTRAOCULAR LENS PLACEMENT (IOC) RIGHT MALYUGIN;  Surgeon: Mittie Gaskin, MD;  Location: [facility];  Service: Ophthalmology;  Laterality: Right;  4.55 0:28.4   CATARACT EXTRACTION W/PHACO Left 10/12/2022   Procedure: CATARACT EXTRACTION PHACO AND INTRAOCULAR LENS PLACEMENT (IOC) LEFT DIABETIC MALYUGIN 4.93 00:41.4;  Surgeon: Mittie Gaskin, MD;  Location: [facility];  Service: Ophthalmology;  Laterality: Left;   CHOLECYSTECTOMY     COLONOSCOPY WITH PROPOFOL  N/A 03/12/2018   Procedure: COLONOSCOPY WITH PROPOFOL ;  Surgeon: Viktoria Lamar DASEN, MD;  Location: [facility] ENDOSCOPY;  Service: Endoscopy;  Laterality: N/A;   CORONARY ANGIOPLASTY     CORONARY ARTERY BYPASS GRAFT  03/06/2014   x 3   EYE SURGERY     TONSILLECTOMY      History reviewed. No pertinent family history.  Social History   Tobacco Use   Smoking status: Never   Smokeless tobacco: Current    Types: Chew  Vaping Use   Vaping status: Never Used  Substance Use Topics   Alcohol use: Yes    Alcohol/week: 2.0 standard drinks of alcohol    Types: 2 Cans of beer per week    Comment: rarely   Drug use: No    No current facility-administered medications for this encounter.  Current Outpatient Medications:    ipratropium (ATROVENT ) 0.06 % nasal spray, Place 2 sprays into both nostrils 4 (four) times daily., Disp: 15 mL, Rfl: 0   naproxen  (NAPROSYN ) 500 MG tablet, Take 1 tablet (500 mg total) by mouth 2 (two) times daily., Disp: 20 tablet, Rfl: 0  oseltamivir  (TAMIFLU ) 75 MG capsule, Take 1 capsule (75 mg total) by mouth 2 (two) times daily. X 5 days, Disp: 10 capsule, Rfl: 0   promethazine -dextromethorphan (PROMETHAZINE -DM) 6.25-15 MG/5ML syrup, Take 5 mLs by mouth 4 (four) times daily as needed for cough., Disp: 118 mL, Rfl: 0   aspirin  81 MG chewable tablet, Chew by mouth., Disp: , Rfl:    aspirin  81 MG tablet, Take 81 mg by mouth daily., Disp: , Rfl:    atorvastatin (LIPITOR) 40 MG tablet, Take by mouth., Disp: , Rfl:    carvedilol (COREG) 6.25 MG tablet, Take 6.25  mg by mouth 2 (two) times daily with a meal., Disp: , Rfl:    furosemide (LASIX) 40 MG tablet, Take by mouth., Disp: , Rfl:    insulin aspart protamine- aspart (NOVOLOG MIX 70/30) (70-30) 100 UNIT/ML injection, Inject into the skin. 14-18 units sub q 3 times daily with meals, Disp: , Rfl:    Insulin Degludec (TRESIBA Epps), Inject 50 Units into the skin every evening., Disp: , Rfl:    insulin detemir (LEVEMIR) 100 UNIT/ML injection, Inject into the skin at bedtime. 60 units sub q nightly, Disp: , Rfl:    lisinopril (PRINIVIL,ZESTRIL) 10 MG tablet, Take 10 mg by mouth daily., Disp: , Rfl:    metFORMIN (GLUCOPHAGE) 500 MG tablet, Take 500 mg by mouth 2 (two) times daily with a meal., Disp: , Rfl:    OZEMPIC, 0.25 OR 0.5 MG/DOSE, 2 MG/3ML SOPN, Apply topically., Disp: , Rfl:    simvastatin (ZOCOR) 40 MG tablet, Take 40 mg by mouth daily., Disp: , Rfl:    spironolactone (ALDACTONE) 25 MG tablet, Take 25 mg by mouth daily., Disp: , Rfl:    vitamin B-12 (CYANOCOBALAMIN) 1000 MCG tablet, Take 1,000 mcg by mouth daily., Disp: , Rfl:   No Known Allergies   ROS  As noted in HPI.   Physical Exam  BP 138/70 (BP Location: Right Arm)   Pulse 83   Temp 98.3 F (36.8 C) (Oral)   Resp 18   SpO2 99%   Constitutional: Well developed, well nourished, no acute distress Eyes:  EOMI, conjunctiva normal bilaterally HENT: Normocephalic, atraumatic,mucus membranes moist.  Positive clear nasal congestion.  Normal turns.  No maxillary, frontal sinus tenderness.  Limited view of oropharynx.  Uvula midline. Neck: Positive cervical lymphadenopathy Respiratory: Normal inspiratory effort, lungs clear bilaterally.  Good air movement.  No anterior, lateral chest wall tenderness Cardiovascular: Normal rate, regular rhythm, no murmurs rubs or gallops. GI: nondistended skin: No rash, skin intact Musculoskeletal: no deformities Neurologic: Alert & oriented x 3, no focal neuro deficits Psychiatric: Speech and behavior  appropriate   ED Course   Medications - No data to display  Orders Placed This Encounter  Procedures   Group A Strep by PCR    Standing Status:   Standing    Number of Occurrences:   1   Resp Panel by RT-PCR (Flu A&B, Covid) Anterior Nasal Swab    Standing Status:   Standing    Number of Occurrences:   1    Results for orders placed or performed during the hospital encounter of 02/21/23 (from the past 24 hours)  Group A Strep by PCR     Status: None   Collection Time: 02/21/23  9:12 AM   Specimen: Throat; Sterile Swab  Result Value Ref Range   Group A Strep by PCR NOT DETECTED NOT DETECTED  Resp Panel by RT-PCR (Flu A&B, Covid) Anterior Nasal Swab  Status: Abnormal   Collection Time: 02/21/23  9:21 AM   Specimen: Anterior Nasal Swab  Result Value Ref Range   SARS Coronavirus 2 by RT PCR NEGATIVE NEGATIVE   Influenza A by PCR POSITIVE (A) NEGATIVE   Influenza B by PCR NEGATIVE NEGATIVE   No results found.  ED Clinical Impression  1. Influenza A   2. Viral URI with cough   3. Encounter for laboratory testing for COVID-19 virus      ED Assessment/Plan    Checking COVID, flu, strep.  Will contact patient at [phone] if any of them are positive and appropriate prescribe the appropriate therapy.  If COVID is positive, patient states that he will come back to get a BMP so that we have a current GFR Paxlovid.  Strep, COVID negative.  Influenza a positive.  Prescribing Tamiflu , even though he is slightly out of the window for 48 hours, because he has significant comorbidities.  Called patient and discussed positive result with him and of prescription waiting for him at the pharmacy.  Advised masking around others for 5 days after symptom onset.  Deferring chest x-ray in the absence of fevers, focal lung findings, hypoxia.  Home with Promethazine  DM, Naprosyn /Tylenol, Atrovent  nasal spray, saline nasal irrigation, Mucinex, discontinue other cold medications.  Return here  or follow-up with PCP for double sickening, or if sick for more than 10 days.  Discussed labs, MDM, treatment plan, and plan for follow-up with patient.. patient agrees with plan.   Meds ordered this encounter  Medications   ipratropium (ATROVENT ) 0.06 % nasal spray    Sig: Place 2 sprays into both nostrils 4 (four) times daily.    Dispense:  15 mL    Refill:  0   promethazine -dextromethorphan (PROMETHAZINE -DM) 6.25-15 MG/5ML syrup    Sig: Take 5 mLs by mouth 4 (four) times daily as needed for cough.    Dispense:  118 mL    Refill:  0   naproxen  (NAPROSYN ) 500 MG tablet    Sig: Take 1 tablet (500 mg total) by mouth 2 (two) times daily.    Dispense:  20 tablet    Refill:  0   oseltamivir  (TAMIFLU ) 75 MG capsule    Sig: Take 1 capsule (75 mg total) by mouth 2 (two) times daily. X 5 days    Dispense:  10 capsule    Refill:  0      *This clinic note was created using Scientist, clinical (histocompatibility and immunogenetics). Therefore, there may be occasional mistakes despite careful proofreading.  ?    Van Knee, MD 02/21/23 1019

## 2023-02-21 NOTE — ED Triage Notes (Signed)
 Pt presents with a cough and sore throat x 3 days. Pt has tried OTC cold medication for his symptoms.

## 2023-02-21 NOTE — Discharge Instructions (Signed)
 We will contact you if your COVID, flu, or strep, positive and prescribe the appropriate medications.  If your COVID is positive, I will give you the option of coming back here to get lab work drawn if we decide to go with Paxlovid versus simply going with molnupiravir.  In the meantime, take Naprosyn  with 1000 mg of Tylenol twice a day.  May take an additional 1000 mg of Tylenol 1 more time a day.  Promethazine  DM for cough.  Saline nasal irrigation with a Aureliano Med rinse and distilled water as often as you want, start Mucinex.  Atrovent  nasal spray for the nasal congestion and postnasal drip.  Discontinue other cold medications.  Return here or follow-up with your PCP if not better in a week, or sooner if you get worse can evaluate for sinus infection or pneumonia at that time.

## 2023-03-20 ENCOUNTER — Ambulatory Visit
Admission: RE | Admit: 2023-03-20 | Payer: BC Managed Care – PPO | Source: Home / Self Care | Admitting: Gastroenterology

## 2023-03-20 ENCOUNTER — Encounter: Admission: RE | Payer: Self-pay | Source: Home / Self Care

## 2023-03-20 SURGERY — COLONOSCOPY WITH PROPOFOL
Anesthesia: General
